# Patient Record
Sex: Male | Born: 1965 | Race: White | Hispanic: No | Marital: Married | Smoking: Current every day smoker
Health system: Southern US, Community
[De-identification: ages and names within clinical notes are randomized; demographics above are authoritative.]

---

## 2004-04-15 ENCOUNTER — Emergency Department (HOSPITAL_COMMUNITY): Admission: EM | Admit: 2004-04-15 | Discharge: 2004-04-15 | Payer: Self-pay | Admitting: *Deleted
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
## 2009-10-11 ENCOUNTER — Emergency Department (HOSPITAL_COMMUNITY): Admission: EM | Admit: 2009-10-11 | Discharge: 2009-10-11 | Payer: Self-pay | Admitting: Emergency Medicine

## 2009-11-08 ENCOUNTER — Emergency Department: Payer: Self-pay | Admitting: Emergency Medicine

## 2010-07-14 ENCOUNTER — Other Ambulatory Visit: Payer: Self-pay | Admitting: *Deleted

## 2010-07-18 ENCOUNTER — Ambulatory Visit
Admission: RE | Admit: 2010-07-18 | Discharge: 2010-07-18 | Disposition: A | Discharge status: Home / Self Care | Payer: 59 | Source: Ambulatory Visit | Attending: *Deleted | Admitting: *Deleted

## 2010-10-09 ENCOUNTER — Ambulatory Visit (INDEPENDENT_AMBULATORY_CARE_PROVIDER_SITE_OTHER): Payer: 59

## 2010-10-09 DIAGNOSIS — I10 Essential (primary) hypertension: Secondary | ICD-10-CM

## 2010-10-09 DIAGNOSIS — Z79899 Other long term (current) drug therapy: Secondary | ICD-10-CM

## 2010-10-09 DIAGNOSIS — F418 Other specified anxiety disorders: Secondary | ICD-10-CM

## 2010-10-09 DIAGNOSIS — B20 Human immunodeficiency virus [HIV] disease: Secondary | ICD-10-CM

## 2010-10-09 DIAGNOSIS — Z113 Encounter for screening for infections with a predominantly sexual mode of transmission: Secondary | ICD-10-CM

## 2010-10-09 DIAGNOSIS — Z111 Encounter for screening for respiratory tuberculosis: Secondary | ICD-10-CM

## 2010-10-09 LAB — COMPREHENSIVE METABOLIC PANEL
BUN: 9 mg/dL (ref 6–23)
CO2: 28 mEq/L (ref 19–32)
Calcium: 9.9 mg/dL (ref 8.4–10.5)
Chloride: 98 mEq/L (ref 96–112)
Creat: 0.74 mg/dL (ref 0.50–1.35)

## 2010-10-09 LAB — LIPID PANEL
Cholesterol: 199 mg/dL (ref 0–200)
Triglycerides: 93 mg/dL (ref <150)

## 2010-10-10 LAB — CBC WITH DIFFERENTIAL/PLATELET
Basophils Absolute: 0 10*3/uL (ref 0.0–0.1)
HCT: 52.7 % — ABNORMAL HIGH (ref 39.0–52.0)
Hemoglobin: 18.2 g/dL — ABNORMAL HIGH (ref 13.0–17.0)
Lymphocytes Relative: 22 % (ref 12–46)
Monocytes Absolute: 0.4 10*3/uL (ref 0.1–1.0)
Monocytes Relative: 8 % (ref 3–12)
Neutro Abs: 3 10*3/uL (ref 1.7–7.7)
RDW: 17.1 % — ABNORMAL HIGH (ref 11.5–15.5)
WBC: 4.3 10*3/uL (ref 4.0–10.5)

## 2010-10-10 LAB — T-HELPER CELL (CD4) - (RCID CLINIC ONLY): CD4 T Cell Abs: 250 uL — ABNORMAL LOW (ref 400–2700)

## 2010-10-13 LAB — HIV-1 RNA QUANT-NO REFLEX-BLD
HIV 1 RNA Quant: 43000 copies/mL — ABNORMAL HIGH (ref <20)
HIV-1 RNA Quant, Log: 4.63 {Log} — ABNORMAL HIGH (ref <1.30)

## 2010-10-15 LAB — TB SKIN TEST: Induration: 0

## 2010-10-27 ENCOUNTER — Encounter: Payer: Self-pay | Admitting: Infectious Diseases

## 2010-10-27 ENCOUNTER — Ambulatory Visit (INDEPENDENT_AMBULATORY_CARE_PROVIDER_SITE_OTHER): Payer: 59 | Admitting: Infectious Diseases

## 2010-10-27 VITALS — BP 164/89 | HR 93 | Temp 99.0°F | Ht 69.0 in | Wt 160.0 lb

## 2010-10-27 DIAGNOSIS — F418 Other specified anxiety disorders: Secondary | ICD-10-CM | POA: Insufficient documentation

## 2010-10-27 DIAGNOSIS — B20 Human immunodeficiency virus [HIV] disease: Secondary | ICD-10-CM

## 2010-10-27 DIAGNOSIS — Z21 Asymptomatic human immunodeficiency virus [HIV] infection status: Secondary | ICD-10-CM

## 2010-10-27 DIAGNOSIS — F341 Dysthymic disorder: Secondary | ICD-10-CM

## 2010-10-27 DIAGNOSIS — I10 Essential (primary) hypertension: Secondary | ICD-10-CM | POA: Insufficient documentation

## 2010-10-27 MED ORDER — EFAVIRENZ-EMTRICITAB-TENOFOVIR 600-200-300 MG PO TABS: 1.0000 | ORAL_TABLET | Freq: Every day | ORAL | Status: DC

## 2010-10-27 NOTE — Assessment & Plan Note (Signed)
Hopefully this will improve somewhat as he starts on ART and feels more secure about his health.

## 2010-10-27 NOTE — Assessment & Plan Note (Signed)
He will continue on his losaartan and will f/u with PCP.

## 2010-10-27 NOTE — Progress Notes (Signed)
  Subjective:    Patient ID: Andrew Farmer, male    DOB: 08/13/1965, 45 y.o.   MRN: [id number]  HPI 45 yo M with HIV+ since February 8,2012 after having rapid test. He was seen at [facility] but felt like he was being pressured to be in ACTG and wanted to go to different provider. He had naive genotype there. CD4 250 and VL 43k (10-09-10).    Review of Systems  Constitutional: Negative for fever and chills.  Gastrointestinal: Negative for diarrhea and constipation.  Genitourinary: Negative for dysuria.  Hematological: Negative for adenopathy. Does not bruise/bleed easily.  Psychiatric/Behavioral: The patient is nervous/anxious.   no dysphagia, no oral ulcers, no thrush.      Objective:   Physical Exam  Constitutional: He appears well-developed and well-nourished.  HENT:       Thrush, mild, L tonsil.   Eyes: EOM are normal. Pupils are equal, round, and reactive to light.  Neck: Neck supple.  Cardiovascular: Normal rate, regular rhythm and normal heart sounds.   Pulmonary/Chest: Effort normal and breath sounds normal.  Abdominal: Soft. Bowel sounds are normal. There is no tenderness. There is no rebound.  Lymphadenopathy:    He has cervical adenopathy.  Skin: Skin is warm and dry. No rash noted.       Small subaceous cyst mid back (<1cm)  Psychiatric:       anxious          Assessment & Plan:

## 2010-10-27 NOTE — Progress Notes (Signed)
Pt has decided to transfer from [facility] because he felt they were trying to use him in multiple studies without actually treating his HIV. He is very anxious and has a friend along for support. He requested that I guarantee we will not force him into any type of research study.  He is ready to start treatment if needed and feel the need to have his lab work reviewed without the threat of research studies.

## 2010-10-27 NOTE — Assessment & Plan Note (Signed)
Med options are discussed with him- will start him on atripla due to his use of H2. He is aware of possible side effects- abn dreams, rash. His partner has tested HIV-, he will continue to get tested every 6 months. They are given condoms. He is cautioned that he may drink ETOH in moderation. Will see him back in 1 month and recheck his labs, hepatitis studies.

## 2010-10-27 NOTE — Progress Notes (Deleted)
  Subjective:    Patient ID: Andrew Farmer, male    DOB: Aug 27, 1965, 45 y.o.   MRN: [id number]  HPI    Review of Systems     Objective:   Physical Exam        Assessment & Plan:

## 2010-11-05 ENCOUNTER — Ambulatory Visit: Payer: 59 | Admitting: Infectious Diseases

## 2010-11-07 ENCOUNTER — Telehealth: Payer: Self-pay | Admitting: *Deleted
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
## 2010-12-01 ENCOUNTER — Ambulatory Visit (INDEPENDENT_AMBULATORY_CARE_PROVIDER_SITE_OTHER): Payer: 59 | Admitting: Infectious Diseases

## 2010-12-01 ENCOUNTER — Encounter: Payer: Self-pay | Admitting: Infectious Diseases

## 2010-12-01 VITALS — BP 152/96 | HR 51 | Temp 98.8°F | Ht 69.0 in | Wt 149.2 lb

## 2010-12-01 DIAGNOSIS — Z23 Encounter for immunization: Secondary | ICD-10-CM

## 2010-12-01 DIAGNOSIS — Z21 Asymptomatic human immunodeficiency virus [HIV] infection status: Secondary | ICD-10-CM

## 2010-12-01 DIAGNOSIS — I10 Essential (primary) hypertension: Secondary | ICD-10-CM

## 2010-12-01 DIAGNOSIS — F341 Dysthymic disorder: Secondary | ICD-10-CM

## 2010-12-01 DIAGNOSIS — B20 Human immunodeficiency virus [HIV] disease: Secondary | ICD-10-CM

## 2010-12-01 DIAGNOSIS — F418 Other specified anxiety disorders: Secondary | ICD-10-CM
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
## 2010-12-01 NOTE — Assessment & Plan Note (Signed)
i encouraged him to take full dose of ambien as opposed to breaking pills in half.

## 2010-12-01 NOTE — Assessment & Plan Note (Signed)
He will continue to f/u with PCP with whom i am grateful to partner.

## 2010-12-01 NOTE — Assessment & Plan Note (Signed)
Tolerating atripla ok, hopefully side effects will lessen with time. Will recheck his VL and CD4 today, he will call back for results next week. He is offered condoms (already has), gets flu shot today. rtc 10 weeks.

## 2010-12-03 LAB — HIV-1 RNA ULTRAQUANT REFLEX TO GENTYP+: HIV-1 RNA Quant, Log: 2.7 {Log} — ABNORMAL HIGH (ref <1.30)

## 2010-12-05 ENCOUNTER — Telehealth: Payer: Self-pay | Admitting: *Deleted

## 2010-12-05 NOTE — Telephone Encounter (Signed)
Patient called for lab results and results given. Wendall Mola CMA

## 2011-01-06 ENCOUNTER — Encounter: Payer: Self-pay | Admitting: Infectious Diseases

## 2011-04-14 ENCOUNTER — Other Ambulatory Visit: Payer: Self-pay | Admitting: Infectious Diseases

## 2011-04-14 DIAGNOSIS — B20 Human immunodeficiency virus [HIV] disease: Secondary | ICD-10-CM

## 2011-04-16 ENCOUNTER — Other Ambulatory Visit: Payer: 59

## 2011-04-20 ENCOUNTER — Other Ambulatory Visit: Payer: 59

## 2011-05-27 ENCOUNTER — Other Ambulatory Visit: Payer: 59

## 2011-05-27 DIAGNOSIS — B20 Human immunodeficiency virus [HIV] disease: Secondary | ICD-10-CM

## 2011-05-28 LAB — COMPREHENSIVE METABOLIC PANEL
BUN: 11 mg/dL (ref 6–23)
CO2: 32 mEq/L (ref 19–32)
Calcium: 9.9 mg/dL (ref 8.4–10.5)
Creat: 0.75 mg/dL (ref 0.50–1.35)
Glucose, Bld: 92 mg/dL (ref 70–99)
Total Bilirubin: 0.4 mg/dL (ref 0.3–1.2)

## 2011-05-28 LAB — CBC WITH DIFFERENTIAL/PLATELET
Eosinophils Absolute: 0.1 10*3/uL (ref 0.0–0.7)
Eosinophils Relative: 1 % (ref 0–5)
HCT: 46.6 % (ref 39.0–52.0)
Lymphs Abs: 3 10*3/uL (ref 0.7–4.0)
MCH: 33.2 pg (ref 26.0–34.0)
MCV: 90.5 fL (ref 78.0–100.0)
Monocytes Absolute: 0.8 10*3/uL (ref 0.1–1.0)
Monocytes Relative: 10 % (ref 3–12)
Platelets: 258 10*3/uL (ref 150–400)
RBC: 5.15 MIL/uL (ref 4.22–5.81)

## 2011-05-28 LAB — T-HELPER CELL (CD4) - (RCID CLINIC ONLY)
CD4 % Helper T Cell: 32 % — ABNORMAL LOW (ref 33–55)
CD4 T Cell Abs: 950 uL (ref 400–2700)

## 2011-06-01 LAB — HIV-1 RNA QUANT-NO REFLEX-BLD
HIV 1 RNA Quant: <20 copies/mL (ref <20)
HIV-1 RNA Quant, Log: <1.3 {Log} (ref <1.30)

## 2011-06-02 ENCOUNTER — Telehealth: Payer: Self-pay | Admitting: Licensed Clinical Social Worker

## 2011-06-02 ENCOUNTER — Telehealth: Payer: Self-pay

## 2011-06-02 NOTE — Telephone Encounter (Signed)
Patient called wanting his lab results from 05/27/2011, I explained to him that all of his results were not in yet and he wanted to know when he could call to get them. I explained to him that he has an appointment for 06/10/2011 and he could discuss his labs with the doctor. He complained that this has never happened before and we have always given him his labs over the phone. I explained to him that this was a rule of the clinic and we were unable to give him the labs over the phone. He did not like what I had to say and called to speak to management.

## 2011-06-02 NOTE — Telephone Encounter (Signed)
   Telephone call transferred to me from triage due to patient requesting to speak with a supervisor.   Pt is calling to request the latest results of his labs.  He was told by Tamika (CMA) he was not able to obtain his labs on the phone.  Pt states he has the legal right to his lab results and he has called before and had no problem. Pt was informed Tamika gave him correct information. I stated  we have a policy that requires the patient to come for his scheduled OV to discuss lab results.  He states he has never been told this before and asked when the policy started.   I informed the patient this was not a new policy but,  at our last office meeting staff was told to enforce the rule of no labs prior to the OV. I explained the purpose was to allow the patient to have face to face time with the physician to discuss his results. I also said sometimes the patients call for their results and no show for the office visit and we do this also to protect the privacy of the patients.  He stated he was not our "typical patient , he was of high status and income. He stated his place of employment to assure me he was "different".  Pt was informed I would check with Dr Ninetta Lights to see if permission could be given to release the lab results prior to the OV.  4:45 pm  Pt left message on voice mail stating I was to call him before  5  pm today and it was near five and he expected a call back within the next ten minutes. I explained I was busy in the clinic and had intentions of calling the patient before leaving today.   Pt was given his  CD-4 and viral load results. He was advised to have a conversation with Dr Ninetta Lights at the next office visit regarding the rules related to receiving lab results via phone or prior to OV. Pt stated he was calling our patient experience officer to report the "hoops we put him through to get his lab results".  He knew his rights and this was his blood and he could get the results whenever  he requested them.   Pt was informed we were only trying to follow policy and protect the privacy of the patients.  He states he was still going to submit his compaint so he could be an advocate for other patients.   Laurell Josephs, RN , BSN

## 2011-06-03 ENCOUNTER — Ambulatory Visit: Payer: 59 | Admitting: Infectious Diseases

## 2011-06-10 ENCOUNTER — Encounter: Payer: Self-pay | Admitting: Infectious Diseases

## 2011-06-10 ENCOUNTER — Ambulatory Visit (INDEPENDENT_AMBULATORY_CARE_PROVIDER_SITE_OTHER): Payer: 59 | Admitting: Infectious Diseases

## 2011-06-10 DIAGNOSIS — B20 Human immunodeficiency virus [HIV] disease: Secondary | ICD-10-CM

## 2011-06-10 DIAGNOSIS — I10 Essential (primary) hypertension: Secondary | ICD-10-CM

## 2011-06-10 DIAGNOSIS — Z21 Asymptomatic human immunodeficiency virus [HIV] infection status: Secondary | ICD-10-CM

## 2011-06-10 DIAGNOSIS — Z720 Tobacco use: Secondary | ICD-10-CM

## 2011-06-10 DIAGNOSIS — F17200 Nicotine dependence, unspecified, uncomplicated: Secondary | ICD-10-CM

## 2011-06-10 NOTE — Assessment & Plan Note (Signed)
Encouraged him to quit, he is resistant to this.

## 2011-06-10 NOTE — Assessment & Plan Note (Addendum)
He is doing very well on his current ART. Will continue this and see him back in 6 months. Offered condoms. Vaccines are up to date, need to check his Hep A-C serologies. I gave him my email address so that he can get his lab results.

## 2011-06-10 NOTE — Progress Notes (Signed)
  Subjective:    Patient ID: Andrew Farmer, male    DOB: 1965/08/15, 46 y.o.   MRN: [id number]  HPI 46 yo M with HIV+ since February 8,2012 after having rapid test. He was seen at [facility] but felt like he was being pressured to be in ACTG and wanted to go to different provider. He had naive genotype there. CD4 250 and VL 43k (10-09-10). He was started on atripla in July 2012.  Still having some vivid dreams. Still some prn use of ambien, some hangover effect. Continues to smoke. Father recently placed in hospice, pt has been estranged. Took himself off bp meds and had very high BP. Now back on, has been back to PCP.No exercise "but I am quite active".     Review of Systems  Constitutional: Negative for fever, chills, appetite change and unexpected weight change.  Gastrointestinal: Negative for diarrhea and constipation.  Genitourinary: Negative for dysuria.       Objective:   Physical Exam  Constitutional: He appears well-developed and well-nourished.  HENT:  Mouth/Throat: No oropharyngeal exudate.  Eyes: EOM are normal. Pupils are equal, round, and reactive to light.  Neck: Neck supple.  Cardiovascular: Normal rate, regular rhythm and normal heart sounds.   Pulmonary/Chest: Effort normal and breath sounds normal.  Abdominal: Soft. Bowel sounds are normal. He exhibits no distension. There is no tenderness.  Lymphadenopathy:    He has no cervical adenopathy.  Psychiatric: He has a normal mood and affect.          Assessment & Plan:

## 2011-06-10 NOTE — Assessment & Plan Note (Signed)
Much better now that he is taking his med. Greatly appreciate PCP f/u.

## 2011-11-30 ENCOUNTER — Other Ambulatory Visit: Payer: Self-pay | Admitting: *Deleted

## 2011-11-30 DIAGNOSIS — B20 Human immunodeficiency virus [HIV] disease: Secondary | ICD-10-CM

## 2011-11-30 MED ORDER — EFAVIRENZ-EMTRICITAB-TENOFOVIR 600-200-300 MG PO TABS: 1.0000 | ORAL_TABLET | Freq: Every day | ORAL | Status: DC

## 2011-12-22 ENCOUNTER — Other Ambulatory Visit: Payer: 59

## 2011-12-22 DIAGNOSIS — B20 Human immunodeficiency virus [HIV] disease: Secondary | ICD-10-CM

## 2011-12-22 LAB — COMPREHENSIVE METABOLIC PANEL
ALT: 41 U/L (ref 0–53)
AST: 29 U/L (ref 0–37)
Albumin: 5 g/dL (ref 3.5–5.2)
CO2: 29 mEq/L (ref 19–32)
Calcium: 10.1 mg/dL (ref 8.4–10.5)
Chloride: 99 mEq/L (ref 96–112)
Creat: 0.9 mg/dL (ref 0.50–1.35)
Potassium: 3.9 mEq/L (ref 3.5–5.3)
Sodium: 137 mEq/L (ref 135–145)
Total Protein: 7.5 g/dL (ref 6.0–8.3)

## 2011-12-22 LAB — HEPATITIS C ANTIBODY: HCV Ab: NEGATIVE

## 2011-12-22 LAB — CBC
Platelets: 272 10*3/uL (ref 150–400)
RBC: 5.44 MIL/uL (ref 4.22–5.81)
RDW: 13.1 % (ref 11.5–15.5)
WBC: 6 10*3/uL (ref 4.0–10.5)

## 2011-12-22 LAB — LIPID PANEL
HDL: 53 mg/dL (ref >39)
Total CHOL/HDL Ratio: 4.1 Ratio
VLDL: 30 mg/dL (ref 0–40)

## 2011-12-23 LAB — HIV-1 RNA QUANT-NO REFLEX-BLD: HIV 1 RNA Quant: 29 copies/mL — ABNORMAL HIGH (ref <20)

## 2011-12-24 LAB — HEPATITIS A ANTIBODY, TOTAL: Hep A Total Ab: POSITIVE — AB

## 2012-01-05 ENCOUNTER — Encounter: Payer: Self-pay | Admitting: Infectious Diseases

## 2012-01-05 ENCOUNTER — Ambulatory Visit (INDEPENDENT_AMBULATORY_CARE_PROVIDER_SITE_OTHER): Payer: 59 | Admitting: Infectious Diseases

## 2012-01-05 VITALS — BP 160/97 | HR 90 | Temp 97.6°F | Ht 69.0 in | Wt 164.0 lb

## 2012-01-05 DIAGNOSIS — Z79899 Other long term (current) drug therapy: Secondary | ICD-10-CM

## 2012-01-05 DIAGNOSIS — I10 Essential (primary) hypertension: Secondary | ICD-10-CM

## 2012-01-05 DIAGNOSIS — Z23 Encounter for immunization: Secondary | ICD-10-CM

## 2012-01-05 DIAGNOSIS — Z113 Encounter for screening for infections with a predominantly sexual mode of transmission: Secondary | ICD-10-CM

## 2012-01-05 DIAGNOSIS — B20 Human immunodeficiency virus [HIV] disease: Secondary | ICD-10-CM

## 2012-01-05 DIAGNOSIS — Z21 Asymptomatic human immunodeficiency virus [HIV] infection status: Secondary | ICD-10-CM

## 2012-01-05 NOTE — Assessment & Plan Note (Signed)
He is asx. I encouraged him to monitor BP at home and at drug stores. He will f/u with his PCP
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
## 2012-01-05 NOTE — Progress Notes (Signed)
  Subjective:    Patient ID: Andrew Farmer, male    DOB: October 15, 1965, 46 y.o.   MRN: [id number]  HPI 46 yo M with HIV+ since February 8,2012 after having rapid test. He was seen at [facility] but felt like he was being pressured to be in ACTG and wanted to go to different provider. He had naive genotype there. CD4 250 and VL 43k (10-09-10). He was started on atripla in July 2012.  Recently had injury to face/between eyes, slid off a ladder. No vision changes, no headaches.  Doing well with atripla, has vivid dreams still.   HIV 1 RNA Quant (copies/mL)  Date Value  12/22/2011 29*  05/27/2011 <20   12/01/2010 496*     CD4 T Cell Abs (cmm)  Date Value  12/22/2011 760   05/27/2011 950   12/01/2010 390*      Review of Systems  Constitutional: Negative for appetite change and unexpected weight change.  Cardiovascular: Negative for chest pain.  Gastrointestinal: Negative for diarrhea and constipation.  Genitourinary: Negative for dysuria.  Neurological: Negative for headaches.       Objective:   Physical Exam  Constitutional: He appears well-developed and well-nourished.    HENT:  Mouth/Throat: No oropharyngeal exudate.  Eyes: EOM are normal. Pupils are equal, round, and reactive to light.  Neck: Neck supple.  Cardiovascular: Normal rate, regular rhythm and normal heart sounds.   Pulmonary/Chest: Effort normal and breath sounds normal.  Abdominal: Soft. Bowel sounds are normal. He exhibits no distension.  Lymphadenopathy:    He has no cervical adenopathy.  Skin:       Abrasion, L forearm. Clean.           Assessment & Plan:

## 2012-04-12 ENCOUNTER — Other Ambulatory Visit: Payer: Self-pay | Admitting: *Deleted

## 2012-04-12 DIAGNOSIS — B20 Human immunodeficiency virus [HIV] disease: Secondary | ICD-10-CM

## 2012-04-12 MED ORDER — EFAVIRENZ-EMTRICITAB-TENOFOVIR 600-200-300 MG PO TABS: 1.0000 | ORAL_TABLET | Freq: Every day | ORAL | Status: DC

## 2012-08-15 ENCOUNTER — Other Ambulatory Visit: Payer: Self-pay | Admitting: Licensed Clinical Social Worker

## 2012-08-15 DIAGNOSIS — B20 Human immunodeficiency virus [HIV] disease: Secondary | ICD-10-CM

## 2012-08-15 MED ORDER — EFAVIRENZ-EMTRICITAB-TENOFOVIR 600-200-300 MG PO TABS: 1.0000 | ORAL_TABLET | Freq: Every day | ORAL | Status: DC

## 2012-08-22 ENCOUNTER — Other Ambulatory Visit: Payer: Self-pay | Admitting: *Deleted

## 2012-08-22 DIAGNOSIS — B20 Human immunodeficiency virus [HIV] disease: Secondary | ICD-10-CM

## 2012-08-22 MED ORDER — EFAVIRENZ-EMTRICITAB-TENOFOVIR 600-200-300 MG PO TABS: 1.0000 | ORAL_TABLET | Freq: Every day | ORAL | Status: DC

## 2012-08-22 NOTE — Telephone Encounter (Signed)
Pt needs to make f/u appt w/ Dr. Hatcher. 

## 2013-01-24 ENCOUNTER — Other Ambulatory Visit: Payer: Self-pay | Admitting: Infectious Diseases

## 2013-02-26 ENCOUNTER — Other Ambulatory Visit: Payer: Self-pay | Admitting: Infectious Diseases

## 2013-03-27 ENCOUNTER — Other Ambulatory Visit: Payer: Self-pay | Admitting: Infectious Diseases

## 2013-04-11 ENCOUNTER — Other Ambulatory Visit: Payer: 59

## 2013-04-11 DIAGNOSIS — Z113 Encounter for screening for infections with a predominantly sexual mode of transmission: Secondary | ICD-10-CM

## 2013-04-11 DIAGNOSIS — Z79899 Other long term (current) drug therapy: Secondary | ICD-10-CM

## 2013-04-11 DIAGNOSIS — B20 Human immunodeficiency virus [HIV] disease: Secondary | ICD-10-CM

## 2013-04-12 LAB — COMPREHENSIVE METABOLIC PANEL
ALBUMIN: 4.5 g/dL (ref 3.5–5.2)
ALT: 34 U/L (ref 0–53)
AST: 29 U/L (ref 0–37)
Alkaline Phosphatase: 64 U/L (ref 39–117)
BUN: 10 mg/dL (ref 6–23)
CALCIUM: 9.6 mg/dL (ref 8.4–10.5)
CHLORIDE: 100 meq/L (ref 96–112)
CO2: 29 meq/L (ref 19–32)
Creat: 0.8 mg/dL (ref 0.50–1.35)
Glucose, Bld: 105 mg/dL — ABNORMAL HIGH (ref 70–99)
Potassium: 3.9 mEq/L (ref 3.5–5.3)
Sodium: 137 mEq/L (ref 135–145)
Total Bilirubin: 0.5 mg/dL (ref 0.3–1.2)
Total Protein: 7.2 g/dL (ref 6.0–8.3)

## 2013-04-12 LAB — LIPID PANEL
CHOLESTEROL: 207 mg/dL — AB (ref 0–200)
HDL: 61 mg/dL (ref >39)
LDL Cholesterol: 129 mg/dL — ABNORMAL HIGH (ref 0–99)
TRIGLYCERIDES: 86 mg/dL (ref <150)
Total CHOL/HDL Ratio: 3.4 Ratio
VLDL: 17 mg/dL (ref 0–40)

## 2013-04-12 LAB — HIV-1 RNA QUANT-NO REFLEX-BLD
HIV 1 RNA Quant: <20 copies/mL (ref <20)
HIV-1 RNA Quant, Log: <1.3 {Log} (ref <1.30)

## 2013-04-12 LAB — CBC WITH DIFFERENTIAL/PLATELET
BASOS ABS: 0 10*3/uL (ref 0.0–0.1)
Basophils Relative: 0 % (ref 0–1)
EOS PCT: 0 % (ref 0–5)
Eosinophils Absolute: 0 10*3/uL (ref 0.0–0.7)
HCT: 44.9 % (ref 39.0–52.0)
HEMOGLOBIN: 16.5 g/dL (ref 13.0–17.0)
LYMPHS ABS: 2 10*3/uL (ref 0.7–4.0)
LYMPHS PCT: 37 % (ref 12–46)
MCH: 33.6 pg (ref 26.0–34.0)
MCHC: 36.7 g/dL — ABNORMAL HIGH (ref 30.0–36.0)
MCV: 91.4 fL (ref 78.0–100.0)
MONO ABS: 0.4 10*3/uL (ref 0.1–1.0)
MONOS PCT: 8 % (ref 3–12)
Neutro Abs: 3 10*3/uL (ref 1.7–7.7)
Neutrophils Relative %: 55 % (ref 43–77)
Platelets: 250 10*3/uL (ref 150–400)
RBC: 4.91 MIL/uL (ref 4.22–5.81)
RDW: 13.6 % (ref 11.5–15.5)
WBC: 5.5 10*3/uL (ref 4.0–10.5)

## 2013-04-12 LAB — RPR

## 2013-04-13 LAB — T-HELPER CELL (CD4) - (RCID CLINIC ONLY)
CD4 T CELL ABS: 900 /uL (ref 400–2700)
CD4 T CELL HELPER: 38 % (ref 33–55)

## 2013-04-25 ENCOUNTER — Other Ambulatory Visit: Payer: Self-pay | Admitting: *Deleted

## 2013-04-25 ENCOUNTER — Telehealth: Payer: Self-pay | Admitting: *Deleted

## 2013-04-25 ENCOUNTER — Encounter: Payer: Self-pay | Admitting: Infectious Diseases

## 2013-04-25 ENCOUNTER — Ambulatory Visit (INDEPENDENT_AMBULATORY_CARE_PROVIDER_SITE_OTHER): Payer: 59 | Admitting: Infectious Diseases

## 2013-04-25 VITALS — BP 155/91 | HR 91 | Temp 98.4°F | Ht 69.0 in | Wt 160.0 lb

## 2013-04-25 DIAGNOSIS — F17200 Nicotine dependence, unspecified, uncomplicated: Secondary | ICD-10-CM

## 2013-04-25 DIAGNOSIS — Z21 Asymptomatic human immunodeficiency virus [HIV] infection status: Secondary | ICD-10-CM

## 2013-04-25 DIAGNOSIS — B20 Human immunodeficiency virus [HIV] disease: Secondary | ICD-10-CM

## 2013-04-25 DIAGNOSIS — Z79899 Other long term (current) drug therapy: Secondary | ICD-10-CM

## 2013-04-25 DIAGNOSIS — Z720 Tobacco use: Secondary | ICD-10-CM

## 2013-04-25 DIAGNOSIS — I10 Essential (primary) hypertension: Secondary | ICD-10-CM

## 2013-04-25 DIAGNOSIS — F341 Dysthymic disorder: Secondary | ICD-10-CM

## 2013-04-25 DIAGNOSIS — F418 Other specified anxiety disorders: Secondary | ICD-10-CM

## 2013-04-25 MED ORDER — CLONAZEPAM 0.5 MG PO TABS: 0.5000 mg | ORAL_TABLET | Freq: Two times a day (BID) | ORAL | Status: DC | PRN

## 2013-04-25 MED ORDER — EFAVIRENZ-EMTRICITAB-TENOFOVIR 600-200-300 MG PO TABS: 1.0000 | ORAL_TABLET | Freq: Every day | ORAL | Status: DC

## 2013-04-25 MED ORDER — ZOLPIDEM TARTRATE 10 MG PO TABS: 10.0000 mg | ORAL_TABLET | Freq: Every evening | ORAL | Status: DC | PRN

## 2013-04-25 NOTE — Assessment & Plan Note (Signed)
Takes klonipin very rarely. Sleeping rx also rarely. Both are refilled today.

## 2013-04-25 NOTE — Progress Notes (Signed)
   Subjective:    Patient ID: Courtney HeysJohn E Allums, male    DOB: 04/12/1965, 48 y.o.   MRN: [id number]  HPI 48 yo M with HIV+ since February 8,2012 after having rapid test. He was seen [facility] but felt like he was being pressured to be in ACTG and wanted to go to different provider. He had naive genotype there. CD4 250 and VL 43k (10-09-10). He was started on atripla in July 2012. Has been doing well, still smoking, 4 beer/day.  Had been checking BP at home- those have been a little high.   HIV 1 RNA Quant (copies/mL)  Date Value  04/11/2013 <20   12/22/2011 29*  05/27/2011 <20      CD4 T Cell Abs (/uL)  Date Value  04/11/2013 900   12/22/2011 760   05/27/2011 950      Review of Systems  Constitutional: Negative for appetite change and unexpected weight change.  Respiratory: Negative for shortness of breath.   Cardiovascular: Negative for chest pain.  Gastrointestinal: Negative for diarrhea, constipation and anal bleeding.  Genitourinary: Negative for difficulty urinating.  Neurological: Negative for headaches.  no anal lesions.      Objective:   Physical Exam  Constitutional: He appears well-developed and well-nourished.  HENT:  Mouth/Throat: No oropharyngeal exudate.  Eyes: EOM are normal. Pupils are equal, round, and reactive to light.  Neck: Normal range of motion. Neck supple.  Cardiovascular: Normal rate, regular rhythm and normal heart sounds.   Pulmonary/Chest: Effort normal and breath sounds normal.  Abdominal: Soft. Bowel sounds are normal. He exhibits no distension. There is no tenderness.  Lymphadenopathy:    He has no cervical adenopathy.          Assessment & Plan:

## 2013-04-25 NOTE — Assessment & Plan Note (Signed)
He continues to home monitor, better at home. Has been asx.

## 2013-04-25 NOTE — Telephone Encounter (Signed)
Verbal order per Dr. Ninetta LightsHatcher given to [facility] nurse, Efraim KaufmannMelissa, for a vancomycin trough, HIV antibody, and Hepatitis A antibody lab. Wendall MolaJacqueline Cockerham CMA

## 2013-04-25 NOTE — Assessment & Plan Note (Signed)
He is doing very well. vax up to date. He is working on diet/exercise to improve lipids. Encouraged him to quit smoking. Offered/refused condoms. Still having some trouble with abn dreams. Will see him back in 6 months.

## 2013-04-25 NOTE — Assessment & Plan Note (Signed)
Encouraged to quit. 

## 2013-10-05 ENCOUNTER — Other Ambulatory Visit: Payer: Self-pay | Admitting: *Deleted

## 2013-10-05 ENCOUNTER — Telehealth: Payer: Self-pay | Admitting: *Deleted

## 2013-10-05 DIAGNOSIS — B20 Human immunodeficiency virus [HIV] disease: Secondary | ICD-10-CM

## 2013-10-05 MED ORDER — EFAVIRENZ-EMTRICITAB-TENOFOVIR 600-200-300 MG PO TABS: 1.0000 | ORAL_TABLET | Freq: Every day | ORAL | Status: DC

## 2013-10-05 NOTE — Telephone Encounter (Signed)
Due to insurance caps, patient wants to be seen on an annual basis.  Please advise if this is ok. Andree CossHowell, Zenobia Kuennen M, RN

## 2013-10-10 ENCOUNTER — Other Ambulatory Visit: Payer: 59

## 2013-10-17 NOTE — Telephone Encounter (Signed)
Ok with me. thanks 

## 2013-10-18 NOTE — Telephone Encounter (Signed)
Left message for patient to call back at his convenience.  Will notify him when he calls back that an annual visit is OK per Dr. Ninetta LightsHatcher.  Need to know if he needs additional refills at this time.

## 2013-10-25 ENCOUNTER — Ambulatory Visit: Payer: 59 | Admitting: Infectious Diseases

## 2014-07-29 ENCOUNTER — Other Ambulatory Visit: Payer: Self-pay | Admitting: Infectious Diseases

## 2014-07-30 ENCOUNTER — Telehealth: Payer: Self-pay | Admitting: *Deleted

## 2014-07-30 NOTE — Telephone Encounter (Signed)
Patient requested refill of Remus Lofflerambien- denied, as patient has not been seen or had labs since 04/2013.  Patient/Dr. Ninetta LightsHatcher agreed to annual visits.  Patient's partner returned the call, made an appointment for 6/15.  Per partner, patient is not in need of refills at this time.  He has not missed medication. Andree CossHowell, Shyra Emile M, RN

## 2014-09-19 ENCOUNTER — Ambulatory Visit: Payer: Self-pay | Admitting: Infectious Diseases

## 2014-10-23 ENCOUNTER — Other Ambulatory Visit: Payer: Self-pay | Admitting: Infectious Diseases

## 2014-10-30 ENCOUNTER — Telehealth: Payer: Self-pay | Admitting: *Deleted

## 2014-10-30 ENCOUNTER — Other Ambulatory Visit: Payer: Self-pay | Admitting: *Deleted

## 2014-10-30 MED ORDER — EFAVIRENZ-EMTRICITAB-TENOFOVIR 600-200-300 MG PO TABS: 1.0000 | ORAL_TABLET | Freq: Every day | ORAL | Status: DC

## 2014-10-30 NOTE — Telephone Encounter (Signed)
thanks

## 2014-10-30 NOTE — Telephone Encounter (Signed)
Patient's husband called to cancel the appointment tomorrow.  Patient has not been seen in 18 months.  He states they cannot afford it at this time as he had surgery and used up his HSA and the patient is currently without insurance. He is also interested in the PREP study as he is HIV negative.   RN returned the call, had to leave a message.  RN advised that there are options (RW/ADAP) that can allow the patient to be seen even without insurance.  RN advised the patient to call and reschedule as soon as possible.  RN also left the number to Deirdre Evener, RN for inquiry into the PREP study. Andree Coss, RN

## 2014-10-30 NOTE — Telephone Encounter (Signed)
Patient does not qualify for PREP study - states he is not "high risk" by their definition.  He would like to be seen by you for PREP in October when his husband is seen.  OK to book two appointments?

## 2014-10-31 ENCOUNTER — Ambulatory Visit: Payer: Self-pay | Admitting: Infectious Diseases

## 2015-01-09 ENCOUNTER — Other Ambulatory Visit: Payer: Self-pay

## 2015-01-09 DIAGNOSIS — Z113 Encounter for screening for infections with a predominantly sexual mode of transmission: Secondary | ICD-10-CM

## 2015-01-09 DIAGNOSIS — Z79899 Other long term (current) drug therapy: Secondary | ICD-10-CM

## 2015-01-09 DIAGNOSIS — B20 Human immunodeficiency virus [HIV] disease: Secondary | ICD-10-CM

## 2015-01-09 LAB — CBC WITH DIFFERENTIAL/PLATELET
BASOS ABS: 0 10*3/uL (ref 0.0–0.1)
Basophils Relative: 0 % (ref 0–1)
Eosinophils Absolute: 0.1 10*3/uL (ref 0.0–0.7)
Eosinophils Relative: 1 % (ref 0–5)
HEMATOCRIT: 41.1 % (ref 39.0–52.0)
HEMOGLOBIN: 14.7 g/dL (ref 13.0–17.0)
LYMPHS PCT: 43 % (ref 12–46)
Lymphs Abs: 2.6 10*3/uL (ref 0.7–4.0)
MCH: 33.4 pg (ref 26.0–34.0)
MCHC: 35.8 g/dL (ref 30.0–36.0)
MCV: 93.4 fL (ref 78.0–100.0)
MPV: 9.8 fL (ref 8.6–12.4)
Monocytes Absolute: 0.6 10*3/uL (ref 0.1–1.0)
Monocytes Relative: 10 % (ref 3–12)
NEUTROS ABS: 2.8 10*3/uL (ref 1.7–7.7)
Neutrophils Relative %: 46 % (ref 43–77)
Platelets: 253 10*3/uL (ref 150–400)
RBC: 4.4 MIL/uL (ref 4.22–5.81)
RDW: 12.7 % (ref 11.5–15.5)
WBC: 6 10*3/uL (ref 4.0–10.5)

## 2015-01-09 LAB — COMPLETE METABOLIC PANEL WITH GFR
ALBUMIN: 4.4 g/dL (ref 3.6–5.1)
ALK PHOS: 52 U/L (ref 40–115)
ALT: 15 U/L (ref 9–46)
AST: 19 U/L (ref 10–40)
BILIRUBIN TOTAL: 0.6 mg/dL (ref 0.2–1.2)
BUN: 8 mg/dL (ref 7–25)
CALCIUM: 9.2 mg/dL (ref 8.6–10.3)
CO2: 27 mmol/L (ref 20–31)
Chloride: 102 mmol/L (ref 98–110)
Creat: 0.87 mg/dL (ref 0.60–1.35)
GFR, Est African American: >89 mL/min (ref >=60)
GLUCOSE: 89 mg/dL (ref 65–99)
Potassium: 3.9 mmol/L (ref 3.5–5.3)
SODIUM: 139 mmol/L (ref 135–146)
TOTAL PROTEIN: 6.6 g/dL (ref 6.1–8.1)

## 2015-01-09 LAB — LIPID PANEL
CHOLESTEROL: 163 mg/dL (ref 125–200)
HDL: 90 mg/dL (ref >=40)
LDL Cholesterol: 66 mg/dL (ref <130)
Total CHOL/HDL Ratio: 1.8 Ratio (ref <=5.0)
Triglycerides: 35 mg/dL (ref <150)
VLDL: 7 mg/dL (ref <30)

## 2015-01-09 NOTE — Addendum Note (Signed)
Addended by: Mariea Clonts D on: 01/09/2015 04:05 PM   Modules accepted: Orders

## 2015-01-10 LAB — RPR

## 2015-01-10 LAB — T-HELPER CELL (CD4) - (RCID CLINIC ONLY)
CD4 T CELL ABS: 1240 /uL (ref 400–2700)
CD4 T CELL HELPER: 44 % (ref 33–55)

## 2015-01-11 LAB — HIV-1 RNA QUANT-NO REFLEX-BLD: HIV 1 RNA Quant: <20 copies/mL (ref <20)

## 2015-01-23 ENCOUNTER — Ambulatory Visit: Payer: Self-pay | Admitting: Infectious Diseases

## 2015-02-01 ENCOUNTER — Other Ambulatory Visit: Payer: Self-pay | Admitting: Infectious Diseases

## 2015-02-01 DIAGNOSIS — B20 Human immunodeficiency virus [HIV] disease: Secondary | ICD-10-CM

## 2015-02-26 ENCOUNTER — Ambulatory Visit (INDEPENDENT_AMBULATORY_CARE_PROVIDER_SITE_OTHER): Payer: 59 | Admitting: Infectious Diseases

## 2015-02-26 ENCOUNTER — Encounter: Payer: Self-pay | Admitting: Infectious Diseases

## 2015-02-26 VITALS — BP 138/78 | HR 66 | Temp 99.0°F | Ht 69.0 in | Wt 144.0 lb

## 2015-02-26 DIAGNOSIS — B20 Human immunodeficiency virus [HIV] disease: Secondary | ICD-10-CM

## 2015-02-26 DIAGNOSIS — Z720 Tobacco use: Secondary | ICD-10-CM

## 2015-02-26 DIAGNOSIS — Z113 Encounter for screening for infections with a predominantly sexual mode of transmission: Secondary | ICD-10-CM | POA: Diagnosis not present

## 2015-02-26 DIAGNOSIS — Z21 Asymptomatic human immunodeficiency virus [HIV] infection status: Secondary | ICD-10-CM | POA: Diagnosis not present

## 2015-02-26 DIAGNOSIS — Z79899 Other long term (current) drug therapy: Secondary | ICD-10-CM

## 2015-02-26 DIAGNOSIS — I10 Essential (primary) hypertension: Secondary | ICD-10-CM

## 2015-02-26 NOTE — Assessment & Plan Note (Signed)
Encouraged to quit. 

## 2015-02-26 NOTE — Assessment & Plan Note (Signed)
He is doing very well.  Offered to change his ART to odefsy (he will research) Offered/refused condoms.  Has gotten flu shot.

## 2015-02-26 NOTE — Progress Notes (Signed)
   Subjective:    Patient ID: Courtney HeysJohn E Raggio, male    DOB: 05/01/1965, 49 y.o.   MRN: [id number]  HPI 49 yo M with HIV+ since February 8,2012 after having rapid test. [facility] but felt like he was being pressured to be in ACTG and wanted to go to different provider. He had naive genotype there. CD4 250 and VL 43k (10-09-10). He was started on atripla in July 2012.  Has been doing well, still smoking, 4 beer/day.  At his previous visit was note to have increased BP.  Has been monitoring at home and has been much better. His wt has come down due to dental extractions.  Partner is on prep.   HIV 1 RNA QUANT (copies/mL)  Date Value  01/09/2015 <20  04/11/2013 <20  12/22/2011 29*   CD4 T CELL ABS  Date Value  01/09/2015 1240 /uL  04/11/2013 900 /uL  12/22/2011 760 cmm    Review of Systems  Constitutional: Negative for appetite change, fatigue and unexpected weight change.  Gastrointestinal: Negative for constipation.  Genitourinary: Negative for difficulty urinating.  Musculoskeletal: Negative for arthralgias.       Objective:   Physical Exam  Constitutional: He appears well-developed and well-nourished.  HENT:  Mouth/Throat: No oropharyngeal exudate.  Eyes: EOM are normal. Pupils are equal, round, and reactive to light.  Neck: Neck supple.  Cardiovascular: Normal rate, regular rhythm and normal heart sounds.   Pulmonary/Chest: Effort normal and breath sounds normal.  Abdominal: Soft. Bowel sounds are normal. There is no tenderness. There is no rebound.  Musculoskeletal: He exhibits no edema.  Lymphadenopathy:    He has no cervical adenopathy.      Assessment & Plan:

## 2015-02-26 NOTE — Assessment & Plan Note (Signed)
Improved will continue to monitor 

## 2015-03-06 ENCOUNTER — Telehealth: Payer: Self-pay | Admitting: *Deleted

## 2015-03-06 ENCOUNTER — Other Ambulatory Visit: Payer: Self-pay | Admitting: *Deleted

## 2015-03-06 DIAGNOSIS — B20 Human immunodeficiency virus [HIV] disease: Secondary | ICD-10-CM

## 2015-03-06 MED ORDER — EMTRICITAB-RILPIVIR-TENOFOV DF 200-25-300 MG PO TABS: 1.0000 | ORAL_TABLET | Freq: Every day | ORAL | Status: DC

## 2015-03-06 MED ORDER — EFAVIRENZ-EMTRICITAB-TENOFOVIR 600-200-300 MG PO TABS: ORAL_TABLET | ORAL | Status: DC

## 2015-03-06 NOTE — Telephone Encounter (Signed)
Pt remaining on Atripla until Darrin LuisGenvoya is covered by his insurance.  Unable to speak with the patient.  Requested pharmacy to call patient when rx ready for pick up

## 2015-06-10 ENCOUNTER — Telehealth: Payer: Self-pay

## 2015-06-10 NOTE — Telephone Encounter (Signed)
PA completed and sent to plan.

## 2015-06-10 NOTE — Telephone Encounter (Signed)
Patient is calling regarding a new change in pharmacy.  He will need authorization for Complera.  [phone] .

## 2015-06-17 ENCOUNTER — Telehealth: Payer: Self-pay | Admitting: *Deleted

## 2015-06-17 NOTE — Telephone Encounter (Signed)
Received fax from [facility] stating that Complera is able to be filled by his pharmacy as it is on his list of covered drugs, no prior auth required. RN faxed the letter to his pharmacy - [phone]. Andree CossHowell, Myley Bahner M, RN

## 2015-06-17 NOTE — Telephone Encounter (Addendum)
Needing high dollar [facility] PA completed on the phone.  [id number].  May take 24-72 hours for approval.

## 2015-06-17 NOTE — Telephone Encounter (Signed)
Is there a phone number that goes with the PA#? thanks

## 2015-06-18 NOTE — Telephone Encounter (Addendum)
PA approved for exception to high dollar co-pay through 06/16/16.

## 2015-09-07 ENCOUNTER — Other Ambulatory Visit: Payer: Self-pay | Admitting: Infectious Diseases

## 2015-09-07 DIAGNOSIS — B20 Human immunodeficiency virus [HIV] disease: Secondary | ICD-10-CM

## 2015-11-04 ENCOUNTER — Other Ambulatory Visit: Payer: Self-pay | Admitting: Infectious Diseases

## 2015-11-04 DIAGNOSIS — B20 Human immunodeficiency virus [HIV] disease: Secondary | ICD-10-CM

## 2015-11-04 NOTE — Telephone Encounter (Signed)
Left message for pt to call [facility] to make MD and LAB appts ASAP.

## 2016-03-11 ENCOUNTER — Other Ambulatory Visit: Payer: Self-pay | Admitting: Infectious Diseases

## 2016-03-11 DIAGNOSIS — B20 Human immunodeficiency virus [HIV] disease: Secondary | ICD-10-CM

## 2016-03-23 ENCOUNTER — Other Ambulatory Visit: Payer: Self-pay | Admitting: Infectious Diseases

## 2016-03-23 DIAGNOSIS — B20 Human immunodeficiency virus [HIV] disease: Secondary | ICD-10-CM

## 2016-03-25 ENCOUNTER — Other Ambulatory Visit: Payer: Self-pay | Admitting: *Deleted

## 2016-03-25 DIAGNOSIS — B20 Human immunodeficiency virus [HIV] disease: Secondary | ICD-10-CM

## 2016-03-25 MED ORDER — EMTRICITAB-RILPIVIR-TENOFOV DF 200-25-300 MG PO TABS: 1.0000 | ORAL_TABLET | Freq: Every day | ORAL | 0 refills | Status: DC

## 2016-03-26 ENCOUNTER — Telehealth: Payer: Self-pay | Admitting: *Deleted

## 2016-03-26 NOTE — Telephone Encounter (Signed)
Refill request from [facility] and patient not seen in over a year. Advised pharmacist to have patient call the clinic for appointment. Andrew MolaJacqueline Cockerham

## 2016-04-01 ENCOUNTER — Other Ambulatory Visit: Payer: Self-pay | Admitting: *Deleted

## 2016-04-01 DIAGNOSIS — B20 Human immunodeficiency virus [HIV] disease: Secondary | ICD-10-CM

## 2016-04-01 MED ORDER — EMTRICITAB-RILPIVIR-TENOFOV DF 200-25-300 MG PO TABS: 1.0000 | ORAL_TABLET | Freq: Every day | ORAL | 0 refills | Status: DC

## 2016-04-02 ENCOUNTER — Ambulatory Visit: Payer: 59

## 2016-04-20 ENCOUNTER — Other Ambulatory Visit: Payer: 59

## 2016-04-20 DIAGNOSIS — Z79899 Other long term (current) drug therapy: Secondary | ICD-10-CM

## 2016-04-20 DIAGNOSIS — B20 Human immunodeficiency virus [HIV] disease: Secondary | ICD-10-CM

## 2016-04-20 DIAGNOSIS — Z113 Encounter for screening for infections with a predominantly sexual mode of transmission: Secondary | ICD-10-CM

## 2016-04-20 LAB — CBC WITH DIFFERENTIAL/PLATELET
BASOS ABS: 0 {cells}/uL (ref 0–200)
Basophils Relative: 0 %
EOS PCT: 1 %
Eosinophils Absolute: 75 cells/uL (ref 15–500)
HCT: 43.6 % (ref 38.5–50.0)
Hemoglobin: 15.3 g/dL (ref 13.2–17.1)
Lymphocytes Relative: 30 %
Lymphs Abs: 2250 cells/uL (ref 850–3900)
MCH: 33.8 pg — AB (ref 27.0–33.0)
MCHC: 35.1 g/dL (ref 32.0–36.0)
MCV: 96.5 fL (ref 80.0–100.0)
MONOS PCT: 10 %
MPV: 9.6 fL (ref 7.5–12.5)
Monocytes Absolute: 750 cells/uL (ref 200–950)
NEUTROS PCT: 59 %
Neutro Abs: 4425 cells/uL (ref 1500–7800)
PLATELETS: 253 10*3/uL (ref 140–400)
RBC: 4.52 MIL/uL (ref 4.20–5.80)
RDW: 13.3 % (ref 11.0–15.0)
WBC: 7.5 10*3/uL (ref 3.8–10.8)

## 2016-04-20 LAB — COMPLETE METABOLIC PANEL WITH GFR
ALBUMIN: 4.6 g/dL (ref 3.6–5.1)
ALK PHOS: 43 U/L (ref 40–115)
ALT: 24 U/L (ref 9–46)
AST: 26 U/L (ref 10–40)
BILIRUBIN TOTAL: 0.5 mg/dL (ref 0.2–1.2)
BUN: 16 mg/dL (ref 7–25)
CO2: 26 mmol/L (ref 20–31)
CREATININE: 0.84 mg/dL (ref 0.60–1.35)
Calcium: 9.6 mg/dL (ref 8.6–10.3)
Chloride: 101 mmol/L (ref 98–110)
GFR, Est African American: >89 mL/min (ref >=60)
GFR, Est Non African American: >89 mL/min (ref >=60)
GLUCOSE: 116 mg/dL — AB (ref 65–99)
Potassium: 3.6 mmol/L (ref 3.5–5.3)
SODIUM: 137 mmol/L (ref 135–146)
TOTAL PROTEIN: 7.1 g/dL (ref 6.1–8.1)

## 2016-04-20 LAB — LIPID PANEL
CHOL/HDL RATIO: 1.9 ratio (ref <5.0)
CHOLESTEROL: 158 mg/dL (ref <200)
HDL: 82 mg/dL (ref >40)
LDL Cholesterol: 64 mg/dL (ref <100)
Triglycerides: 60 mg/dL (ref <150)
VLDL: 12 mg/dL (ref <30)

## 2016-04-21 LAB — T-HELPER CELL (CD4) - (RCID CLINIC ONLY)
CD4 T CELL HELPER: 43 % (ref 33–55)
CD4 T Cell Abs: 1070 /uL (ref 400–2700)

## 2016-04-21 LAB — RPR

## 2016-04-23 LAB — HIV-1 RNA QUANT-NO REFLEX-BLD
HIV 1 RNA QUANT: NOT DETECTED {copies}/mL (ref <20)
HIV-1 RNA Quant, Log: <1.3 Log copies/mL (ref <1.30)

## 2016-04-24 LAB — HLA B*5701: HLA-B*5701 w/rflx HLA-B High: NEGATIVE

## 2016-05-04 ENCOUNTER — Ambulatory Visit: Payer: 59 | Admitting: Infectious Diseases

## 2016-05-06 ENCOUNTER — Ambulatory Visit (INDEPENDENT_AMBULATORY_CARE_PROVIDER_SITE_OTHER): Payer: 59 | Admitting: Infectious Diseases

## 2016-05-06 ENCOUNTER — Encounter: Payer: Self-pay | Admitting: Infectious Diseases

## 2016-05-06 VITALS — BP 119/74 | HR 75 | Temp 98.6°F | Ht 69.0 in | Wt 151.8 lb

## 2016-05-06 DIAGNOSIS — I10 Essential (primary) hypertension: Secondary | ICD-10-CM

## 2016-05-06 DIAGNOSIS — B20 Human immunodeficiency virus [HIV] disease: Secondary | ICD-10-CM | POA: Diagnosis not present

## 2016-05-06 DIAGNOSIS — Z720 Tobacco use: Secondary | ICD-10-CM | POA: Diagnosis not present

## 2016-05-06 DIAGNOSIS — Z789 Other specified health status: Secondary | ICD-10-CM | POA: Diagnosis not present

## 2016-05-06 DIAGNOSIS — Z113 Encounter for screening for infections with a predominantly sexual mode of transmission: Secondary | ICD-10-CM

## 2016-05-06 DIAGNOSIS — Z79899 Other long term (current) drug therapy: Secondary | ICD-10-CM

## 2016-05-06 MED ORDER — EMTRICITAB-RILPIVIR-TENOFOV DF 200-25-300 MG PO TABS: 1.0000 | ORAL_TABLET | Freq: Every day | ORAL | 4 refills | Status: DC

## 2016-05-06 NOTE — Assessment & Plan Note (Signed)
Encouraged to quit.  Couldn't tolerate chantix.  Going to get nicotine replacement from PCP

## 2016-05-06 NOTE — Assessment & Plan Note (Addendum)
Will see him yearly Refill his meds Has gotten flu Partner on PrEP, tested q3 months.  Offered to help him with meds if his insurance changes Will change complera to odefsy when his insurance is clarifired, stable.  Offered/refused condoms.

## 2016-05-06 NOTE — Assessment & Plan Note (Addendum)
On ARB.  F/u PCP Doing well

## 2016-05-06 NOTE — Progress Notes (Signed)
   Subjective:    Patient ID: Courtney HeysJohn E Ambrocio, male    DOB: 11/26/1965, 51 y.o.   MRN: [id number]  HPI 51 yo M with HIV+ since February 8,2012 after having rapid test. [facility] but felt like he was being pressured to be in ACTG and wanted to go to different provider. He had naive genotype there. CD4 250 and VL 43k (10-09-10). He was started on atripla in July 2012--> complera March 2017 Partner on PrEP. No problems with ART.  Got flu vax Sept 2017.   HIV 1 RNA Quant (copies/mL)  Date Value  04/20/2016 <20 NOT DETECTED  01/09/2015 <20  04/11/2013 <20   CD4 T Cell Abs (/uL)  Date Value  04/20/2016 1,070  01/09/2015 1,240  04/11/2013 900    Review of Systems  Constitutional: Negative for appetite change and unexpected weight change.  Gastrointestinal: Negative for constipation and diarrhea.  Genitourinary: Negative for difficulty urinating.   Exercise is working in a [employer].     Objective:   Physical Exam  Constitutional: He appears well-developed and well-nourished.  HENT:  Mouth/Throat: No oropharyngeal exudate.  Eyes: EOM are normal. Pupils are equal, round, and reactive to light.  Neck: Neck supple.  Cardiovascular: Normal rate, regular rhythm and normal heart sounds.   Pulmonary/Chest: Effort normal and breath sounds normal.  Abdominal: Soft. Bowel sounds are normal. There is no tenderness. There is no rebound.  Musculoskeletal: He exhibits no edema.  Lymphadenopathy:    He has no cervical adenopathy.       Assessment & Plan:

## 2016-07-09 ENCOUNTER — Other Ambulatory Visit: Payer: Self-pay | Admitting: *Deleted

## 2016-07-09 ENCOUNTER — Ambulatory Visit: Payer: Self-pay

## 2016-07-09 DIAGNOSIS — B20 Human immunodeficiency virus [HIV] disease: Secondary | ICD-10-CM

## 2016-07-09 MED ORDER — EMTRICITAB-RILPIVIR-TENOFOV DF 200-25-300 MG PO TABS: 1.0000 | ORAL_TABLET | Freq: Every day | ORAL | 4 refills | Status: DC

## 2016-07-14 ENCOUNTER — Encounter: Payer: Self-pay | Admitting: Infectious Diseases

## 2016-08-11 ENCOUNTER — Telehealth: Payer: Self-pay | Admitting: *Deleted

## 2016-08-11 ENCOUNTER — Other Ambulatory Visit: Payer: Self-pay | Admitting: *Deleted

## 2016-08-11 DIAGNOSIS — I10 Essential (primary) hypertension: Secondary | ICD-10-CM

## 2016-08-11 DIAGNOSIS — B20 Human immunodeficiency virus [HIV] disease: Secondary | ICD-10-CM

## 2016-08-11 MED ORDER — LOSARTAN POTASSIUM 100 MG PO TABS: 100.0000 mg | ORAL_TABLET | Freq: Every day | ORAL | 3 refills | Status: DC

## 2016-08-11 MED ORDER — EMTRICITAB-RILPIVIR-TENOFOV DF 200-25-300 MG PO TABS: 1.0000 | ORAL_TABLET | Freq: Every day | ORAL | 3 refills | Status: DC

## 2016-08-11 NOTE — Telephone Encounter (Signed)
Ok to refill 

## 2016-08-11 NOTE — Telephone Encounter (Signed)
[facility] calling to request refills of complera and losartan.  Please advise if ok to refill losartan, as this is listed as a historic medication. Andree CossHowell, Isaiha Asare M, RN

## 2016-08-12 NOTE — Telephone Encounter (Signed)
Losartan 100mg  not on ADAP formulary. [facility] patient could take benicar 20 mg daily. Please advise. Andree CossHowell, Tia Hieronymus M, RN

## 2016-08-13 ENCOUNTER — Telehealth: Payer: Self-pay | Admitting: *Deleted

## 2016-08-13 NOTE — Telephone Encounter (Signed)
This is not on patient's med list so [facility] Thank you

## 2016-08-13 NOTE — Telephone Encounter (Signed)
[facility] stating that losartan is not covered on patient's ADAP and is requesting an alternative. Please advise

## 2016-08-13 NOTE — Telephone Encounter (Signed)
Send in Benicar 40mg  daily

## 2016-08-14 ENCOUNTER — Other Ambulatory Visit: Payer: Self-pay | Admitting: Pharmacist Clinician (PhC)/ Clinical Pharmacy Specialist

## 2016-08-14 MED ORDER — OLMESARTAN MEDOXOMIL 40 MG PO TABS: 40.0000 mg | ORAL_TABLET | Freq: Every day | ORAL | 5 refills | Status: DC

## 2016-08-14 NOTE — Telephone Encounter (Signed)
It's sent

## 2016-08-14 NOTE — Progress Notes (Signed)
Changing losartan to Benicar due to ADAP formulary.

## 2016-08-19 ENCOUNTER — Other Ambulatory Visit: Payer: Self-pay | Admitting: Pharmacist Clinician (PhC)/ Clinical Pharmacy Specialist

## 2016-08-19 MED ORDER — HYDROCHLOROTHIAZIDE 25 MG PO TABS: 25.0000 mg | ORAL_TABLET | Freq: Every day | ORAL | 11 refills | Status: DC

## 2016-08-19 NOTE — Progress Notes (Signed)
Since ADAP covers Benicar and HCTZ separately, we'll send in both components.

## 2016-11-09 ENCOUNTER — Ambulatory Visit: Payer: Self-pay

## 2016-11-12 ENCOUNTER — Encounter: Payer: Self-pay | Admitting: Infectious Diseases

## 2016-11-17 ENCOUNTER — Other Ambulatory Visit: Payer: Self-pay | Admitting: Infectious Diseases

## 2016-11-17 ENCOUNTER — Telehealth: Payer: Self-pay | Admitting: *Deleted

## 2016-11-17 DIAGNOSIS — F418 Other specified anxiety disorders: Secondary | ICD-10-CM

## 2016-11-17 DIAGNOSIS — B20 Human immunodeficiency virus [HIV] disease: Secondary | ICD-10-CM

## 2016-11-17 NOTE — Telephone Encounter (Signed)
Patient called to request a Rx for Ambien. Advised he can not see his PCP because he no longer has insurance and can not afford the copay. He also states that he has not used the medication for about 1 year but recently has been having trouble staying asleep. Advised him we have not given him this Rx since 2015 and he will need to be seen before we can refill if the doctor chooses to. He advised he will call back to schedule an appt. Advised him not to wait to long as we are already scheduling out to October for [facility].

## 2016-12-14 ENCOUNTER — Other Ambulatory Visit: Payer: Self-pay | Admitting: Infectious Diseases

## 2016-12-14 DIAGNOSIS — B20 Human immunodeficiency virus [HIV] disease: Secondary | ICD-10-CM

## 2017-01-07 ENCOUNTER — Other Ambulatory Visit: Payer: Self-pay | Admitting: Infectious Diseases

## 2017-01-07 DIAGNOSIS — B20 Human immunodeficiency virus [HIV] disease: Secondary | ICD-10-CM

## 2017-02-05 ENCOUNTER — Other Ambulatory Visit: Payer: Self-pay | Admitting: Infectious Diseases

## 2017-04-21 ENCOUNTER — Other Ambulatory Visit: Payer: Self-pay

## 2017-04-21 DIAGNOSIS — Z113 Encounter for screening for infections with a predominantly sexual mode of transmission: Secondary | ICD-10-CM

## 2017-04-21 DIAGNOSIS — Z79899 Other long term (current) drug therapy: Secondary | ICD-10-CM

## 2017-04-21 DIAGNOSIS — B20 Human immunodeficiency virus [HIV] disease: Secondary | ICD-10-CM

## 2017-04-22 LAB — CBC
HEMATOCRIT: 40.7 % (ref 38.5–50.0)
Hemoglobin: 14.7 g/dL (ref 13.2–17.1)
MCH: 33.5 pg — ABNORMAL HIGH (ref 27.0–33.0)
MCHC: 36.1 g/dL — AB (ref 32.0–36.0)
MCV: 92.7 fL (ref 80.0–100.0)
MPV: 10.4 fL (ref 7.5–12.5)
PLATELETS: 235 10*3/uL (ref 140–400)
RBC: 4.39 10*6/uL (ref 4.20–5.80)
RDW: 11.5 % (ref 11.0–15.0)
WBC: 6.3 10*3/uL (ref 3.8–10.8)

## 2017-04-22 LAB — COMPREHENSIVE METABOLIC PANEL
AG Ratio: 1.9 (calc) (ref 1.0–2.5)
ALBUMIN MSPROF: 4.7 g/dL (ref 3.6–5.1)
ALT: 36 U/L (ref 9–46)
AST: 55 U/L — AB (ref 10–40)
Alkaline phosphatase (APISO): 47 U/L (ref 40–115)
BUN: 15 mg/dL (ref 7–25)
CHLORIDE: 106 mmol/L (ref 98–110)
CO2: 23 mmol/L (ref 20–32)
CREATININE: 1.12 mg/dL (ref 0.60–1.35)
Calcium: 9.8 mg/dL (ref 8.6–10.3)
GLOBULIN: 2.5 g/dL (ref 1.9–3.7)
GLUCOSE: 84 mg/dL (ref 65–99)
Potassium: 4.9 mmol/L (ref 3.5–5.3)
SODIUM: 137 mmol/L (ref 135–146)
TOTAL PROTEIN: 7.2 g/dL (ref 6.1–8.1)
Total Bilirubin: 0.7 mg/dL (ref 0.2–1.2)

## 2017-04-22 LAB — LIPID PANEL
CHOL/HDL RATIO: 1.8 (calc) (ref <5.0)
Cholesterol: 159 mg/dL (ref <200)
HDL: 90 mg/dL (ref >40)
LDL CHOLESTEROL (CALC): 61 mg/dL
NON-HDL CHOLESTEROL (CALC): 69 mg/dL (ref <130)
TRIGLYCERIDES: 29 mg/dL (ref <150)

## 2017-04-22 LAB — T-HELPER CELL (CD4) - (RCID CLINIC ONLY)
CD4 % Helper T Cell: 41 % (ref 33–55)
CD4 T CELL ABS: 880 /uL (ref 400–2700)

## 2017-04-22 LAB — RPR: RPR Ser Ql: NONREACTIVE

## 2017-04-23 ENCOUNTER — Other Ambulatory Visit: Payer: Self-pay | Admitting: Infectious Diseases

## 2017-04-23 DIAGNOSIS — B20 Human immunodeficiency virus [HIV] disease: Secondary | ICD-10-CM

## 2017-04-23 LAB — HIV-1 RNA QUANT-NO REFLEX-BLD
HIV 1 RNA Quant: <20 copies/mL — AB
HIV-1 RNA QUANT, LOG: DETECTED {Log_copies}/mL — AB

## 2017-04-27 ENCOUNTER — Encounter: Payer: Self-pay | Admitting: Infectious Diseases

## 2017-05-05 ENCOUNTER — Ambulatory Visit: Payer: 59 | Admitting: Infectious Diseases

## 2017-05-05 ENCOUNTER — Encounter: Payer: Self-pay | Admitting: Infectious Diseases

## 2017-05-05 ENCOUNTER — Ambulatory Visit (INDEPENDENT_AMBULATORY_CARE_PROVIDER_SITE_OTHER): Payer: Self-pay | Admitting: Infectious Diseases

## 2017-05-05 VITALS — BP 130/82 | HR 80 | Temp 98.4°F | Wt 145.0 lb

## 2017-05-05 DIAGNOSIS — F418 Other specified anxiety disorders: Secondary | ICD-10-CM

## 2017-05-05 DIAGNOSIS — I10 Essential (primary) hypertension: Secondary | ICD-10-CM

## 2017-05-05 DIAGNOSIS — Z113 Encounter for screening for infections with a predominantly sexual mode of transmission: Secondary | ICD-10-CM

## 2017-05-05 DIAGNOSIS — Z720 Tobacco use: Secondary | ICD-10-CM

## 2017-05-05 DIAGNOSIS — B20 Human immunodeficiency virus [HIV] disease: Secondary | ICD-10-CM

## 2017-05-05 MED ORDER — EMTRICITAB-RILPIVIR-TENOFOV AF 200-25-25 MG PO TABS: 1.0000 | ORAL_TABLET | Freq: Every day | ORAL | 3 refills | Status: DC

## 2017-05-05 MED ORDER — ZOLPIDEM TARTRATE 10 MG PO TABS: 10.0000 mg | ORAL_TABLET | Freq: Every evening | ORAL | 3 refills | Status: DC | PRN

## 2017-05-05 MED ORDER — CLONAZEPAM 0.5 MG PO TABS: 0.5000 mg | ORAL_TABLET | Freq: Two times a day (BID) | ORAL | 3 refills | Status: DC | PRN

## 2017-05-05 NOTE — Assessment & Plan Note (Signed)
Doing well on Benicar.  He has questions about wether this causes Cancer. Await FDA review.

## 2017-05-05 NOTE — Assessment & Plan Note (Signed)
He is doing very well Partner on prep.  has condoms [facility] reviews.  rtc in 9 months.

## 2017-05-05 NOTE — Assessment & Plan Note (Signed)
Encourage him to quit smoking.  vaping as bridge to quit.

## 2017-05-05 NOTE — Assessment & Plan Note (Signed)
Will refill his amiben and klonipin.

## 2017-05-05 NOTE — Progress Notes (Signed)
   Subjective:    Patient ID: Andrew Farmer, male    DOB: 10/02/1965, 52 y.o.   MRN: [id number]  HPI 52 yo M with HIV+ since February 8,2012 after having rapid test. [facility] but felt like he was being pressured to be in ACTG and wanted to go to different provider. He had naive genotype there. CD4 250 and VL 43k (10-09-10). He was started on atripla in July 2012--> complera March 2017 Partner on PrEP.  Got flu vax Sept 2018 Has been having worsening anxiety, insomnia. Prev took [place], [place].  Had pain in his R shoulder from his "backpack blower"  HIV 1 RNA Quant (copies/mL)  Date Value  04/21/2017 <20 DETECTED (A)  04/20/2016 <20 NOT DETECTED  01/09/2015 <20   CD4 T Cell Abs (/uL)  Date Value  04/21/2017 880  04/20/2016 1,070  01/09/2015 1,240   Still smoking. Tried cymbalta. Interested in vaping.   Review of Systems  Constitutional: Negative for appetite change and unexpected weight change.  HENT: Negative for rhinorrhea.   Respiratory: Negative for cough and shortness of breath.   Gastrointestinal: Negative for constipation and diarrhea.  Genitourinary: Negative for difficulty urinating.  Psychiatric/Behavioral: Positive for sleep disturbance. The patient is nervous/anxious.   Please see HPI. All other systems reviewed and negative.     Objective:   Physical Exam  Constitutional: He appears well-developed and well-nourished.  HENT:  Mouth/Throat: No oropharyngeal exudate.  Eyes: EOM are normal. Pupils are equal, round, and reactive to light.  Neck: Neck supple.  Cardiovascular: Normal rate, regular rhythm and normal heart sounds.  Pulmonary/Chest: Effort normal and breath sounds normal.  Abdominal: Soft. Bowel sounds are normal. There is no tenderness. There is no rebound.  Musculoskeletal: He exhibits no edema.  Lymphadenopathy:    He has no cervical adenopathy.      Assessment & Plan:

## 2017-05-19 ENCOUNTER — Other Ambulatory Visit: Payer: Self-pay | Admitting: Infectious Diseases

## 2017-05-19 ENCOUNTER — Other Ambulatory Visit: Payer: Self-pay | Admitting: Behavioral Health

## 2017-05-19 DIAGNOSIS — I10 Essential (primary) hypertension: Secondary | ICD-10-CM

## 2017-05-19 DIAGNOSIS — B20 Human immunodeficiency virus [HIV] disease: Secondary | ICD-10-CM

## 2017-05-19 NOTE — Telephone Encounter (Signed)
A refill request was received for Complera for patient however patient medication list and note state [facility] was to start [facility] and discontinue Complera.  Writer called patient to clarify if he has started taking Odefsey which was ordered at his last visit 05/05/2017. Patient states he has not talked to his partner about the switch yet and would like to continue on Complera.

## 2017-05-20 ENCOUNTER — Other Ambulatory Visit: Payer: Self-pay | Admitting: Infectious Diseases

## 2017-05-20 DIAGNOSIS — B20 Human immunodeficiency virus [HIV] disease: Secondary | ICD-10-CM

## 2017-05-25 ENCOUNTER — Other Ambulatory Visit: Payer: Self-pay | Admitting: Infectious Diseases

## 2017-05-25 DIAGNOSIS — B20 Human immunodeficiency virus [HIV] disease: Secondary | ICD-10-CM

## 2017-05-27 ENCOUNTER — Other Ambulatory Visit: Payer: Self-pay | Admitting: Infectious Diseases

## 2017-05-27 ENCOUNTER — Telehealth: Payer: Self-pay | Admitting: *Deleted

## 2017-05-27 DIAGNOSIS — B20 Human immunodeficiency virus [HIV] disease: Secondary | ICD-10-CM

## 2017-05-27 NOTE — Telephone Encounter (Signed)
Patient called and does not want to change medications. He would like to stay on Complera. Advised will let his provider know and give him a call back once I get the ok.

## 2017-05-31 ENCOUNTER — Other Ambulatory Visit: Payer: Self-pay | Admitting: *Deleted

## 2017-05-31 DIAGNOSIS — B20 Human immunodeficiency virus [HIV] disease: Secondary | ICD-10-CM

## 2017-05-31 DIAGNOSIS — Z21 Asymptomatic human immunodeficiency virus [HIV] infection status: Secondary | ICD-10-CM

## 2017-05-31 MED ORDER — EMTRICITAB-RILPIVIR-TENOFOV DF 200-25-300 MG PO TABS: 1.0000 | ORAL_TABLET | Freq: Every day | ORAL | 5 refills | Status: DC

## 2017-05-31 NOTE — Telephone Encounter (Signed)
Medication sent to the pharmacy.

## 2017-05-31 NOTE — Telephone Encounter (Signed)
Ok to refill complera thanks

## 2017-10-12 ENCOUNTER — Ambulatory Visit: Payer: Self-pay

## 2017-10-15 ENCOUNTER — Encounter: Payer: Self-pay | Admitting: Infectious Diseases

## 2017-11-18 ENCOUNTER — Other Ambulatory Visit: Payer: Self-pay | Admitting: Infectious Diseases

## 2017-11-18 DIAGNOSIS — B20 Human immunodeficiency virus [HIV] disease: Secondary | ICD-10-CM

## 2017-11-18 DIAGNOSIS — I10 Essential (primary) hypertension: Secondary | ICD-10-CM

## 2017-12-16 ENCOUNTER — Other Ambulatory Visit: Payer: Self-pay | Admitting: Infectious Diseases

## 2017-12-16 DIAGNOSIS — B20 Human immunodeficiency virus [HIV] disease: Secondary | ICD-10-CM

## 2017-12-16 DIAGNOSIS — I10 Essential (primary) hypertension: Secondary | ICD-10-CM

## 2018-01-17 ENCOUNTER — Other Ambulatory Visit: Payer: Self-pay | Admitting: Infectious Diseases

## 2018-01-17 DIAGNOSIS — B20 Human immunodeficiency virus [HIV] disease: Secondary | ICD-10-CM

## 2018-01-17 DIAGNOSIS — I10 Essential (primary) hypertension: Secondary | ICD-10-CM

## 2018-01-19 ENCOUNTER — Other Ambulatory Visit: Payer: Self-pay | Admitting: Behavioral Health

## 2018-01-19 DIAGNOSIS — B20 Human immunodeficiency virus [HIV] disease: Secondary | ICD-10-CM

## 2018-01-19 DIAGNOSIS — I10 Essential (primary) hypertension: Secondary | ICD-10-CM

## 2018-01-19 MED ORDER — EMTRICITAB-RILPIVIR-TENOFOV DF 200-25-300 MG PO TABS: 1.0000 | ORAL_TABLET | Freq: Every day | ORAL | 0 refills | Status: DC

## 2018-01-19 MED ORDER — BENICAR 40 MG PO TABS: 40.0000 mg | ORAL_TABLET | Freq: Every day | ORAL | 0 refills | Status: DC

## 2018-01-27 ENCOUNTER — Other Ambulatory Visit: Payer: Self-pay

## 2018-01-27 DIAGNOSIS — Z113 Encounter for screening for infections with a predominantly sexual mode of transmission: Secondary | ICD-10-CM

## 2018-01-27 DIAGNOSIS — B20 Human immunodeficiency virus [HIV] disease: Secondary | ICD-10-CM

## 2018-01-28 LAB — T-HELPER CELL (CD4) - (RCID CLINIC ONLY)
CD4 T CELL ABS: 970 /uL (ref 400–2700)
CD4 T CELL HELPER: 47 % (ref 33–55)

## 2018-01-31 LAB — COMPREHENSIVE METABOLIC PANEL
AG RATIO: 1.7 (calc) (ref 1.0–2.5)
ALBUMIN MSPROF: 4.8 g/dL (ref 3.6–5.1)
ALKALINE PHOSPHATASE (APISO): 50 U/L (ref 40–115)
ALT: 53 U/L — ABNORMAL HIGH (ref 9–46)
AST: 63 U/L — ABNORMAL HIGH (ref 10–35)
BUN: 15 mg/dL (ref 7–25)
CHLORIDE: 104 mmol/L (ref 98–110)
CO2: 25 mmol/L (ref 20–32)
Calcium: 10.1 mg/dL (ref 8.6–10.3)
Creat: 1.11 mg/dL (ref 0.70–1.33)
GLOBULIN: 2.8 g/dL (ref 1.9–3.7)
Glucose, Bld: 78 mg/dL (ref 65–99)
Potassium: 4.7 mmol/L (ref 3.5–5.3)
Sodium: 137 mmol/L (ref 135–146)
TOTAL PROTEIN: 7.6 g/dL (ref 6.1–8.1)
Total Bilirubin: 0.8 mg/dL (ref 0.2–1.2)

## 2018-01-31 LAB — CBC
HEMATOCRIT: 38.9 % (ref 38.5–50.0)
Hemoglobin: 14.1 g/dL (ref 13.2–17.1)
MCH: 33.7 pg — ABNORMAL HIGH (ref 27.0–33.0)
MCHC: 36.2 g/dL — ABNORMAL HIGH (ref 32.0–36.0)
MCV: 93.1 fL (ref 80.0–100.0)
MPV: 10.3 fL (ref 7.5–12.5)
Platelets: 268 10*3/uL (ref 140–400)
RBC: 4.18 10*6/uL — ABNORMAL LOW (ref 4.20–5.80)
RDW: 12.2 % (ref 11.0–15.0)
WBC: 6.1 10*3/uL (ref 3.8–10.8)

## 2018-01-31 LAB — HIV-1 RNA QUANT-NO REFLEX-BLD
HIV 1 RNA QUANT: DETECTED {copies}/mL — AB
HIV-1 RNA QUANT, LOG: DETECTED {Log_copies}/mL — AB

## 2018-01-31 LAB — RPR: RPR Ser Ql: NONREACTIVE

## 2018-02-01 LAB — URINE CYTOLOGY ANCILLARY ONLY
Chlamydia: NEGATIVE
Neisseria Gonorrhea: NEGATIVE

## 2018-02-10 ENCOUNTER — Encounter: Payer: Self-pay | Admitting: Family

## 2018-02-10 ENCOUNTER — Ambulatory Visit (INDEPENDENT_AMBULATORY_CARE_PROVIDER_SITE_OTHER): Payer: Self-pay | Admitting: Family

## 2018-02-10 VITALS — BP 113/74 | HR 85 | Temp 98.4°F | Wt 140.0 lb

## 2018-02-10 DIAGNOSIS — Z21 Asymptomatic human immunodeficiency virus [HIV] infection status: Secondary | ICD-10-CM

## 2018-02-10 DIAGNOSIS — Z23 Encounter for immunization: Secondary | ICD-10-CM

## 2018-02-10 DIAGNOSIS — R748 Abnormal levels of other serum enzymes: Secondary | ICD-10-CM

## 2018-02-10 MED ORDER — EMTRICITAB-RILPIVIR-TENOFOV AF 200-25-25 MG PO TABS: 1.0000 | ORAL_TABLET | Freq: Every day | ORAL | 5 refills | Status: DC

## 2018-02-10 NOTE — Progress Notes (Signed)
Subjective:    Patient ID: Andrew Farmer, male    DOB: 08-03-1965, 52 y.o.   MRN: [id number]  Chief Complaint  Patient presents with  . HIV Positive/AIDS     HPI:  Andrew Farmer is a 52 y.o. male who presents today for routine follow up of HIV disease.   Andrew Farmer was last seen in the office on on 05/05/2017 for routine follow-up and was maintained Complera from his initial regimen of a Atripla.  All his partner remained on PrEP. He was going to be changed to [facility] at his medication renewal.  His most recent blood work was completed on 01/27/2018 with a viral load that remains undetectable and a CD4 count of 970.  Liver function tests slightly elevated with an AST of 63 and ALT of 53.  Kidney function and electrolytes within normal ranges.  Andrew Farmer has been taking his Complera as prescribed with no adverse side effects. Takes his medication about 2 hours after he eats.  He is no longer taking the Zantac.  Has not missed any doses and has no problems obtaining the medication from his mail-order pharmacy.   Denies fevers, chills, night sweats, headaches, changes in vision, neck pain/stiffness, nausea, diarrhea, vomiting, lesions or rashes.   No Known Allergies    Outpatient Medications Prior to Visit  Medication Sig Dispense Refill  . BENICAR 40 MG tablet Take 1 tablet (40 mg total) by mouth daily. 30 tablet 0  . clonazePAM (KLONOPIN) 0.5 MG tablet Take 1 tablet (0.5 mg total) by mouth 2 (two) times daily as needed for anxiety. PCP 30 tablet 3  . zolpidem (AMBIEN) 10 MG tablet Take 1 tablet (10 mg total) by mouth at bedtime as needed. 30 tablet 3  . emtricitabine-rilpivir-tenofovir DF (COMPLERA) 200-25-300 MG tablet Take 1 tablet by mouth daily. 30 tablet 0  . ranitidine (ZANTAC) 150 MG tablet Take 150 mg by mouth 1 day or 1 dose.       No facility-administered medications prior to visit.      History reviewed. No pertinent past medical history.   History reviewed. No  pertinent surgical history.    Review of Systems  Constitutional: Negative for appetite change, chills, fatigue, fever and unexpected weight change.  Eyes: Negative for visual disturbance.  Respiratory: Negative for cough, chest tightness, shortness of breath and wheezing.   Cardiovascular: Negative for chest pain and leg swelling.  Gastrointestinal: Negative for abdominal pain, constipation, diarrhea, nausea and vomiting.  Genitourinary: Negative for dysuria, flank pain, frequency, genital sores, hematuria and urgency.  Skin: Negative for rash.  Allergic/Immunologic: Negative for immunocompromised state.  Neurological: Negative for dizziness and headaches.      Objective:    BP 113/74   Pulse 85   Temp 98.4 F (36.9 C) (Oral)   Wt 140 lb (63.5 kg)   BMI 20.67 kg/m  Nursing note and vital signs reviewed.  Physical Exam  Constitutional: He is oriented to person, place, and time. He appears well-developed. No distress.  HENT:  Mouth/Throat: Oropharynx is clear and moist.  Eyes: Conjunctivae are normal.  Neck: Neck supple.  Cardiovascular: Normal rate, regular rhythm, normal heart sounds and intact distal pulses. Exam reveals no gallop and no friction rub.  No murmur heard. Pulmonary/Chest: Effort normal and breath sounds normal. No respiratory distress. He has no wheezes. He has no rales. He exhibits no tenderness.  Abdominal: Soft. Bowel sounds are normal. There is no tenderness.  Lymphadenopathy:    He  has no cervical adenopathy.  Neurological: He is alert and oriented to person, place, and time.  Skin: Skin is warm and dry. No rash noted.  Psychiatric: He has a normal mood and affect. His behavior is normal. Judgment and thought content normal.       Assessment & Plan:   Problem List Items Addressed This Visit      Other   HIV (human immunodeficiency virus infection) (HCC) - Primary    Andrew Farmer has well controlled HIV disease with his current regimen of Complera  with no adverse side effects or missed doses. No signs/symptoms of opportunistic infection or progressive HIV disease. Influenza and Pneumovax updated today. Discussed in length of changing him to [facility] to reduce the risk of kidney dysfunction and decreased bone mineral density. Will change Complera to Odfesey. Plan for follow up with pharmacy for VL check in 1 month and with provider in 6 months or sooner if needed with lab work 1-2 weeks prior to appointment. Note: Partner is also on PrEP and discussed speaking with his PCP about changing to Descovy.       Relevant Medications   emtricitabine-rilpivir-tenofovir AF (ODEFSEY) 200-25-25 MG TABS tablet   Other Relevant Orders   T-helper cell (CD4)- ([facility] clinic only)   HIV-1 RNA quant-no reflex-bld   CBC   Comprehensive metabolic panel   RPR   Lipid panel   US ABDOMEN COMPLETE W/ELASTOGRAPHY   Pneumococcal polysaccharide vaccine 23-valent greater than or equal to 2yo subcutaneous/IM (Completed)   Elevated liver enzymes    Andrew Farmer continues to have elevated liver enzymes with concern for his alcohol consumption as he is currently consuming about 6 drinks per night most nights of the week. Will obtain ultrasound with elastography for baseline. Discussed importance on cutting back on alcohol intake with risk for progressive liver dysfunction and possible cirrhosis.        Other Visit Diagnoses    Need for pneumococcal vaccination       Relevant Orders   Pneumococcal polysaccharide vaccine 23-valent greater than or equal to 2yo subcutaneous/IM (Completed)   Need for immunization against influenza       Relevant Orders   Flu Vaccine QUAD 36+ mos IM (Completed)       I have discontinued Andrew Farmer's ranitidine and emtricitabine-rilpivir-tenofovir DF. I am also having him start on emtricitabine-rilpivir-tenofovir AF. Additionally, I am having him maintain his clonazePAM, zolpidem, and BENICAR.   Meds ordered this encounter    Medications  . emtricitabine-rilpivir-tenofovir AF (ODEFSEY) 200-25-25 MG TABS tablet    Sig: Take 1 tablet by mouth daily.    Dispense:  30 tablet    Refill:  5    Order Specific Question:   Supervising Provider    Answer:   Judyann Munson [[id number]]     Follow-up: Return in about 6 months (around 08/11/2018), or if symptoms worsen or fail to improve.   Marcos Eke, MSN, FNP-C Nurse Practitioner [facility] for Infectious Disease [facility] Office phone: [phone] Pager: [phone] [facility] Main number: [phone]

## 2018-02-10 NOTE — Assessment & Plan Note (Signed)
Andrew Farmer continues to have elevated liver enzymes with concern for his alcohol consumption as he is currently consuming about 6 drinks per night most nights of the week. Will obtain ultrasound with elastography for baseline. Discussed importance on cutting back on alcohol intake with risk for progressive liver dysfunction and possible cirrhosis.

## 2018-02-10 NOTE — Assessment & Plan Note (Signed)
Andrew Farmer has well controlled HIV disease with his current regimen of Complera with no adverse side effects or missed doses. No signs/symptoms of opportunistic infection or progressive HIV disease. Influenza and Pneumovax updated today. Discussed in length of changing him to [facility] to reduce the risk of kidney dysfunction and decreased bone mineral density. Will change Complera to Odfesey. Plan for follow up with pharmacy for VL check in 1 month and with provider in 6 months or sooner if needed with lab work 1-2 weeks prior to appointment. Note: Partner is also on PrEP and discussed speaking with his PCP about changing to Descovy.

## 2018-02-10 NOTE — Patient Instructions (Signed)
Nice to meet you.  Please start taking the [facility] once your receive it.  Plan for a follow up office visit with Cassie in the pharmacy in 1 month to recheck your viral load.  Follow up office visit in 6 months or sooner if needed with blood work 1-2 weeks prior to your appointment.

## 2018-02-14 ENCOUNTER — Other Ambulatory Visit: Payer: Self-pay | Admitting: Family

## 2018-02-14 DIAGNOSIS — I10 Essential (primary) hypertension: Secondary | ICD-10-CM

## 2018-02-16 ENCOUNTER — Ambulatory Visit (HOSPITAL_COMMUNITY)
Admission: RE | Admit: 2018-02-16 | Discharge: 2018-02-16 | Disposition: A | Discharge status: Home / Self Care | Payer: Self-pay | Source: Ambulatory Visit | Attending: Family | Admitting: Family

## 2018-02-16 DIAGNOSIS — K760 Fatty (change of) liver, not elsewhere classified: Secondary | ICD-10-CM | POA: Insufficient documentation

## 2018-02-16 DIAGNOSIS — Z21 Asymptomatic human immunodeficiency virus [HIV] infection status: Secondary | ICD-10-CM | POA: Insufficient documentation

## 2018-03-16 ENCOUNTER — Ambulatory Visit: Payer: Self-pay

## 2018-04-11 ENCOUNTER — Ambulatory Visit (INDEPENDENT_AMBULATORY_CARE_PROVIDER_SITE_OTHER): Payer: Self-pay | Admitting: Pharmacist

## 2018-04-11 DIAGNOSIS — Z21 Asymptomatic human immunodeficiency virus [HIV] infection status: Secondary | ICD-10-CM

## 2018-04-11 NOTE — Progress Notes (Signed)
HPI: Andrew Farmer E Farmer is a 53 y.o. male who presents to the [facility] pharmacy clinic for HIV follow-up after switching medications.  Patient Active Problem List   Diagnosis Date Noted  . Elevated liver enzymes 02/10/2018  . Hepatitis B immune 05/06/2016  . Tobacco abuse 06/10/2011  . Essential hypertension 10/27/2010  . Depression with anxiety 10/27/2010  . HIV (human immunodeficiency virus infection) (HCC) 10/27/2010    Patient's Medications  New Prescriptions   No medications on file  Previous Medications   BENICAR 40 MG TABLET    TAKE 1 TABLET(40 MG) BY MOUTH DAILY   CLONAZEPAM (KLONOPIN) 0.5 MG TABLET    Take 1 tablet (0.5 mg total) by mouth 2 (two) times daily as needed for anxiety. PCP   EMTRICITABINE-RILPIVIR-TENOFOVIR AF (ODEFSEY) 200-25-25 MG TABS TABLET    Take 1 tablet by mouth daily.   ZOLPIDEM (AMBIEN) 10 MG TABLET    Take 1 tablet (10 mg total) by mouth at bedtime as needed.  Modified Medications   No medications on file  Discontinued Medications   No medications on file    Allergies: No Known Allergies  Past Medical History: No past medical history on file.  Social History: Social History   Socioeconomic History  . Marital status: Single    Spouse name: Not on file  . Number of children: Not on file  . Years of education: Not on file  . Highest education level: Not on file  Occupational History  . Not on file  Social Needs  . Financial resource strain: Not on file  . Food insecurity:    Worry: Not on file    Inability: Not on file  . Transportation needs:    Medical: Not on file    Non-medical: Not on file  Tobacco Use  . Smoking status: Current Every Day Smoker    Packs/day: 1.30    Years: 20.00    Pack years: 26.00    Types: Cigarettes  . Smokeless tobacco: Never Used  . Tobacco comment: has chantix at home - not yet ready  Substance and Sexual Activity  . Alcohol use: Yes    Alcohol/week: 28.0 standard drinks    Types: 28 Standard drinks or  equivalent per week    Comment: decreased from daily alcohol intake  . Drug use: No  . Sexual activity: Yes    Partners: Male    Comment: declined condoms today  Lifestyle  . Physical activity:    Days per week: Not on file    Minutes per session: Not on file  . Stress: Not on file  Relationships  . Social connections:    Talks on phone: Not on file    Gets together: Not on file    Attends religious service: Not on file    Active member of club or organization: Not on file    Attends meetings of clubs or organizations: Not on file    Relationship status: Not on file  Other Topics Concern  . Not on file  Social History Narrative  . Not on file    Labs: Lab Results  Component Value Date   HIV1RNAQUANT <20 DETECTED (A) 04/11/2018   HIV1RNAQUANT <20 DETECTED (A) 01/27/2018   HIV1RNAQUANT <20 DETECTED (A) 04/21/2017   CD4TABS 970 01/27/2018   CD4TABS 880 04/21/2017   CD4TABS 1,070 04/20/2016    RPR and STI Lab Results  Component Value Date   LABRPR NON-REACTIVE 01/27/2018   LABRPR NON-REACTIVE 04/21/2017   LABRPR NON  REAC 04/20/2016   LABRPR NON REAC 01/09/2015   LABRPR NON REAC 04/11/2013    STI Results GC CT  01/27/2018 Negative Negative    Hepatitis B Lab Results  Component Value Date   HEPBSAB POS (A) 12/22/2011   HEPBSAG NEGATIVE 12/22/2011   HEPBCAB POS (A) 12/22/2011   Hepatitis C No results found for: HEPCAB, HCVRNAPCRQN Hepatitis A Lab Results  Component Value Date   HAV POS (A) 12/22/2011   Lipids: Lab Results  Component Value Date   CHOL 159 04/21/2017   TRIG 29 04/21/2017   HDL 90 04/21/2017   CHOLHDL 1.8 04/21/2017   VLDL 12 04/20/2016   LDLCALC 61 04/21/2017    Current HIV Regimen: Odefsey  Assessment: Andrew Farmer is here today for labs and follow-up after switching from Complera to [place].  He is having no issues tolerating Odefsey and cannot tell a difference since switching. No issues with paying for the medication or insurance  changes. Will check a HIV viral load today to be sure he remains undetectable after switching.  He is taking it with food.  Plan: - Continue Odefsey PO once daily with food - F/u with Andrew Farmer 5/13 at 430pm  [name] L. Dorma Altman, PharmD, BCIDP, AAHIVP, CPP Infectious Diseases Clinical Pharmacist [facility] 04/13/2018, 3:11 PM

## 2018-04-13 LAB — HIV-1 RNA QUANT-NO REFLEX-BLD
HIV 1 RNA Quant: <20 copies/mL — AB
HIV-1 RNA Quant, Log: <1.3 Log copies/mL — AB

## 2018-04-20 ENCOUNTER — Ambulatory Visit: Payer: Self-pay

## 2018-04-21 ENCOUNTER — Encounter: Payer: Self-pay | Admitting: Family

## 2018-06-30 ENCOUNTER — Other Ambulatory Visit: Payer: Self-pay | Admitting: Family

## 2018-06-30 DIAGNOSIS — I10 Essential (primary) hypertension: Secondary | ICD-10-CM

## 2018-07-26 ENCOUNTER — Other Ambulatory Visit: Payer: Self-pay | Admitting: Family

## 2018-07-26 DIAGNOSIS — I10 Essential (primary) hypertension: Secondary | ICD-10-CM

## 2018-07-26 DIAGNOSIS — Z21 Asymptomatic human immunodeficiency virus [HIV] infection status: Secondary | ICD-10-CM

## 2018-07-27 ENCOUNTER — Other Ambulatory Visit: Payer: Self-pay

## 2018-07-27 DIAGNOSIS — Z21 Asymptomatic human immunodeficiency virus [HIV] infection status: Secondary | ICD-10-CM

## 2018-07-28 LAB — T-HELPER CELL (CD4) - (RCID CLINIC ONLY)
CD4 % Helper T Cell: 48 % (ref 33–55)
CD4 T Cell Abs: 960 /uL (ref 400–2700)

## 2018-07-30 LAB — COMPREHENSIVE METABOLIC PANEL
AG Ratio: 1.7 (calc) (ref 1.0–2.5)
ALT: 20 U/L (ref 9–46)
AST: 33 U/L (ref 10–35)
Albumin: 4.7 g/dL (ref 3.6–5.1)
Alkaline phosphatase (APISO): 34 U/L — ABNORMAL LOW (ref 35–144)
BUN: 17 mg/dL (ref 7–25)
CO2: 26 mmol/L (ref 20–32)
Calcium: 10.2 mg/dL (ref 8.6–10.3)
Chloride: 103 mmol/L (ref 98–110)
Creat: 1.2 mg/dL (ref 0.70–1.33)
Globulin: 2.7 g/dL (calc) (ref 1.9–3.7)
Glucose, Bld: 89 mg/dL (ref 65–99)
Potassium: 5 mmol/L (ref 3.5–5.3)
Sodium: 136 mmol/L (ref 135–146)
Total Bilirubin: 1.2 mg/dL (ref 0.2–1.2)
Total Protein: 7.4 g/dL (ref 6.1–8.1)

## 2018-07-30 LAB — CBC
HCT: 39.1 % (ref 38.5–50.0)
Hemoglobin: 14 g/dL (ref 13.2–17.1)
MCH: 34.2 pg — ABNORMAL HIGH (ref 27.0–33.0)
MCHC: 35.8 g/dL (ref 32.0–36.0)
MCV: 95.6 fL (ref 80.0–100.0)
MPV: 10 fL (ref 7.5–12.5)
Platelets: 240 10*3/uL (ref 140–400)
RBC: 4.09 10*6/uL — ABNORMAL LOW (ref 4.20–5.80)
RDW: 11.7 % (ref 11.0–15.0)
WBC: 5.7 10*3/uL (ref 3.8–10.8)

## 2018-07-30 LAB — LIPID PANEL
Cholesterol: 191 mg/dL (ref <200)
HDL: 103 mg/dL (ref >=40)
LDL Cholesterol (Calc): 79 mg/dL (calc)
Non-HDL Cholesterol (Calc): 88 mg/dL (calc) (ref <130)
Total CHOL/HDL Ratio: 1.9 (calc) (ref <5.0)
Triglycerides: 33 mg/dL (ref <150)

## 2018-07-30 LAB — HIV-1 RNA QUANT-NO REFLEX-BLD
HIV 1 RNA Quant: <20 copies/mL
HIV-1 RNA Quant, Log: <1.3 Log copies/mL

## 2018-07-30 LAB — RPR: RPR Ser Ql: NONREACTIVE

## 2018-08-16 NOTE — Progress Notes (Signed)
Subjective:    Patient ID: Andrew Farmer, male    DOB: 03/09/1966, 53 y.o.   MRN: [id number]  Chief Complaint  Patient presents with  . HIV Positive/AIDS     Virtual Visit via Telephone Note   I connected with [name]  on 08/17/2018 at 4:30 PM by telephone and verified that I am speaking with the correct person using two identifiers.   I discussed the limitations, risks, security and privacy concerns of performing an evaluation and management service by telephone and the availability of in person appointments. I also discussed with the patient that there may be a patient responsible charge related to this service. The patient expressed understanding and agreed to proceed.   HPI:  Andrew Farmer is a 53 y.o. male last seen in the office on 02/10/2018 with good adherence and tolerance to his ART regimen of Complera and ultimately switched to [facility].  Viral load at the time found to be undetectable with CD4 count of 970.  In the interim he met with pharmacy staff for follow-up on 04/11/2018 with continued undetectable status.  Most recent blood work completed on 07/27/2018 with viral load remaining undetectable and CD4 count of 960.  RPR was nonreactive.  Renal function, liver function, and electrolytes within normal ranges.  Healthcare maintenance due includes Menveo, Prevnar, and colon cancer screening with colonoscopy.  Andrew Farmer has been taking his Andrew Farmer as prescribed with no adverse side effects or missed doses. Not feeling so good today. Ordered pizza last night and believes that he may have a viral stomach bug as he has had acute onset nausea, vomiting and diarrhea today. His fiance has similar symptoms as well. He did take 2 Tums to helpw with his symptoms and immodium to help with the diarrhea.   Andrew Farmer has no problems obtaining/paying for his medications as he remains covered through [facility] and obtains his medications from [facility]. He has had some anxiety recently but denies  feelings of being down, depressed or hopeless. 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 Due for a colon cancer screening. Dental exam up to date.     No Known Allergies    Outpatient Medications Prior to Visit  Medication Sig Dispense Refill  . BENICAR 40 MG tablet TAKE 1 TABLET(40 MG) BY MOUTH DAILY 30 tablet 5  . clonazePAM (KLONOPIN) 0.5 MG tablet Take 1 tablet (0.5 mg total) by mouth 2 (two) times daily as needed for anxiety. PCP 30 tablet 3  . zolpidem (AMBIEN) 10 MG tablet Take 1 tablet (10 mg total) by mouth at bedtime as needed. 30 tablet 3  . ODEFSEY 200-25-25 MG TABS tablet TAKE 1 TABLET BY MOUTH DAILY 30 tablet 0   No facility-administered medications prior to visit.      History reviewed. No pertinent past medical history.   History reviewed. No pertinent surgical history.     Review of Systems  Constitutional: Negative for appetite change, chills, fatigue, fever and unexpected weight change.  Eyes: Negative for visual disturbance.  Respiratory: Negative for cough, chest tightness, shortness of breath and wheezing.   Cardiovascular: Negative for chest pain and leg swelling.  Gastrointestinal: Positive for diarrhea and nausea. Negative for abdominal pain, constipation and vomiting.  Genitourinary: Negative for dysuria, flank pain, frequency, genital sores, hematuria and urgency.  Skin: Negative for rash.  Allergic/Immunologic: Negative for immunocompromised state.  Neurological:  Negative for dizziness and headaches.      Objective:    Nursing note and vital signs reviewed.    Andrew Farmer is pleasant to speak with. His tone sounds to be somber Assessment & Plan:   Problem List Items Addressed This Visit      Digestive   Viral gastroenteritis    Andrew Farmer has signs/symptoms of consistent with viral gastroenteritis likely from food consumed the night before.  Recommend supportive care and symptom management as needed. Continue hydration with water and electrolytes as tolerated. Follow up if symptoms worsen or do not improve.       Relevant Medications   emtricitabine-rilpivir-tenofovir AF (ODEFSEY) 200-25-25 MG TABS tablet     Other   HIV (human immunodeficiency virus infection) ([place]) - Primary    Andrew Farmer has well controlled HIV disease with good adherence and tolerance to his ART regimen of Odefsey.  No signs/symptoms of opportunistic infection or progressive HIV disease.  He has no problems obtaining his medications.  Will need to remain [facility] in July.  Continue current dose of Odefsey.  Plan for follow-up in 6 months or sooner if needed with lab work 1 to 2 weeks prior to appointment.      Relevant Medications   emtricitabine-rilpivir-tenofovir AF (ODEFSEY) 200-25-25 MG TABS tablet   Other Relevant Orders   HIV-1 RNA quant-no reflex-bld   CBC   Comprehensive metabolic panel   T-helper cell (CD4)- ([facility] clinic only)   Healthcare maintenance     Due for Prevnar next office visit  Dental screening up-to-date with most recent exam 2 months ago.  Due for colonoscopy  Discussed importance of safe sexual practice to reduce risk of acquisition/transmission of STI.  Currently in monogamous relationship with fianc.          I have changed Andrew Farmer to emtricitabine-rilpivir-tenofovir AF. I am also having him maintain his clonazePAM, zolpidem, and Benicar.   Meds ordered this encounter  Medications  . emtricitabine-rilpivir-tenofovir AF (ODEFSEY) 200-25-25 MG TABS tablet    Sig: Take 1 tablet by mouth daily.    Dispense:  30 tablet    Refill:  5    Order Specific Question:   Supervising Provider    Answer:   Carlyle Basques [phone]     I discussed the assessment and treatment plan with the patient. The patient was provided an opportunity to ask questions and all were answered. The patient agreed with the plan and  demonstrated an understanding of the instructions.   The patient was advised to call back or seek an in-person evaluation if the symptoms worsen or if the condition fails to improve as anticipated.   I provided 13  minutes of non-face-to-face time during this encounter.  Follow-up: Return in about 6 months (around 02/17/2019), or if symptoms worsen or fail to improve.   Terri Piedra, MSN, FNP-C Nurse Practitioner [facility] for Infectious Disease [place] number: [phone]

## 2018-08-17 ENCOUNTER — Other Ambulatory Visit: Payer: Self-pay

## 2018-08-17 ENCOUNTER — Encounter: Payer: Self-pay | Admitting: Family

## 2018-08-17 ENCOUNTER — Ambulatory Visit (INDEPENDENT_AMBULATORY_CARE_PROVIDER_SITE_OTHER): Payer: Self-pay | Admitting: Family

## 2018-08-17 DIAGNOSIS — A084 Viral intestinal infection, unspecified: Secondary | ICD-10-CM | POA: Insufficient documentation

## 2018-08-17 DIAGNOSIS — Z21 Asymptomatic human immunodeficiency virus [HIV] infection status: Secondary | ICD-10-CM

## 2018-08-17 DIAGNOSIS — Z0000 Encounter for general adult medical examination without abnormal findings: Secondary | ICD-10-CM | POA: Insufficient documentation

## 2018-08-17 HISTORY — DX: Viral intestinal infection, unspecified: A08.4

## 2018-08-17 MED ORDER — EMTRICITAB-RILPIVIR-TENOFOV AF 200-25-25 MG PO TABS: 1.0000 | ORAL_TABLET | Freq: Every day | ORAL | 5 refills | Status: DC

## 2018-08-17 NOTE — Patient Instructions (Signed)
Nice to speak with you.  Continue to take your [facility] as prescribed.   Hoping you feel better soon.  Plan for follow up in 6 months or sooner if needed.  Please plan to renew your UMAP in July.   Have a great day and stay safe.

## 2018-08-17 NOTE — Assessment & Plan Note (Signed)
   Due for Prevnar next office visit  Dental screening up-to-date with most recent exam 2 months ago.  Due for colonoscopy  Discussed importance of safe sexual practice to reduce risk of acquisition/transmission of STI.  Currently in monogamous relationship with fianc.

## 2018-08-17 NOTE — Assessment & Plan Note (Signed)
Andrew Farmer has signs/symptoms of consistent with viral gastroenteritis likely from food consumed the night before. Recommend supportive care and symptom management as needed. Continue hydration with water and electrolytes as tolerated. Follow up if symptoms worsen or do not improve.

## 2018-08-17 NOTE — Assessment & Plan Note (Signed)
Mr. Alioto has well controlled HIV disease with good adherence and tolerance to his ART regimen of Odefsey.  No signs/symptoms of opportunistic infection or progressive HIV disease.  He has no problems obtaining his medications.  Will need to remain [facility]MAP in July.  Continue current dose of Odefsey.  Plan for follow-up in 6 months or sooner if needed with lab work 1 to 2 weeks prior to appointment.

## 2018-09-10 ENCOUNTER — Other Ambulatory Visit: Payer: Self-pay | Admitting: Infectious Diseases

## 2018-09-10 DIAGNOSIS — B20 Human immunodeficiency virus [HIV] disease: Secondary | ICD-10-CM

## 2018-09-10 DIAGNOSIS — F418 Other specified anxiety disorders: Secondary | ICD-10-CM

## 2018-09-12 ENCOUNTER — Telehealth: Payer: Self-pay | Admitting: *Deleted

## 2018-09-12 DIAGNOSIS — F418 Other specified anxiety disorders: Secondary | ICD-10-CM

## 2018-09-12 DIAGNOSIS — B20 Human immunodeficiency virus [HIV] disease: Secondary | ICD-10-CM

## 2018-09-12 DIAGNOSIS — Z21 Asymptomatic human immunodeficiency virus [HIV] infection status: Secondary | ICD-10-CM

## 2018-09-12 MED ORDER — CLONAZEPAM 0.5 MG PO TABS: 0.5000 mg | ORAL_TABLET | Freq: Two times a day (BID) | ORAL | 2 refills | Status: DC | PRN

## 2018-09-12 MED ORDER — ZOLPIDEM TARTRATE 10 MG PO TABS: 10.0000 mg | ORAL_TABLET | Freq: Every evening | ORAL | 2 refills | Status: DC | PRN

## 2018-09-12 NOTE — Addendum Note (Signed)
Addended by: Mauricio Po D on: 09/12/2018 03:36 PM   Modules accepted: Orders

## 2018-09-12 NOTE — Telephone Encounter (Signed)
Received refill request for klonopin and ambien.  Please advise. Landis Gandy, RN

## 2018-09-12 NOTE — Telephone Encounter (Signed)
Medications have been refilled and sent to the pharmacy. 

## 2018-09-13 MED ORDER — CLONAZEPAM 0.5 MG PO TABS: 0.5000 mg | ORAL_TABLET | Freq: Two times a day (BID) | ORAL | 2 refills | Status: DC | PRN

## 2018-09-13 MED ORDER — ZOLPIDEM TARTRATE 10 MG PO TABS: 10.0000 mg | ORAL_TABLET | Freq: Every evening | ORAL | 2 refills | Status: DC | PRN

## 2018-10-28 ENCOUNTER — Encounter: Payer: Self-pay | Admitting: Infectious Diseases

## 2018-12-28 ENCOUNTER — Other Ambulatory Visit: Payer: Self-pay | Admitting: Family

## 2018-12-28 DIAGNOSIS — I10 Essential (primary) hypertension: Secondary | ICD-10-CM

## 2019-01-24 ENCOUNTER — Other Ambulatory Visit: Payer: Self-pay | Admitting: Family

## 2019-01-24 DIAGNOSIS — Z21 Asymptomatic human immunodeficiency virus [HIV] infection status: Secondary | ICD-10-CM

## 2019-02-08 ENCOUNTER — Other Ambulatory Visit: Payer: Self-pay | Admitting: Family

## 2019-02-08 DIAGNOSIS — B20 Human immunodeficiency virus [HIV] disease: Secondary | ICD-10-CM

## 2019-02-08 DIAGNOSIS — F418 Other specified anxiety disorders: Secondary | ICD-10-CM

## 2019-02-09 ENCOUNTER — Telehealth: Payer: Self-pay | Admitting: *Deleted

## 2019-02-09 ENCOUNTER — Other Ambulatory Visit: Payer: Self-pay | Admitting: Family

## 2019-02-09 DIAGNOSIS — F418 Other specified anxiety disorders: Secondary | ICD-10-CM

## 2019-02-09 DIAGNOSIS — B20 Human immunodeficiency virus [HIV] disease: Secondary | ICD-10-CM

## 2019-02-09 MED ORDER — CLONAZEPAM 0.5 MG PO TABS: 0.5000 mg | ORAL_TABLET | Freq: Two times a day (BID) | ORAL | 0 refills | Status: DC | PRN

## 2019-02-09 MED ORDER — ZOLPIDEM TARTRATE 10 MG PO TABS: 10.0000 mg | ORAL_TABLET | Freq: Every evening | ORAL | 0 refills | Status: DC | PRN

## 2019-02-09 NOTE — Telephone Encounter (Signed)
Patient is requesting refills of his klonopin and Lorrin Mais, would like [facility] sent [facility] [place]. Rn scheduled patient for 6 month follow up (labs and flu shot in person, evisit with Marya Amsler at patient's request) 11/11 and 11/25. Landis Gandy, RN

## 2019-02-09 NOTE — Telephone Encounter (Signed)
30 day refill of medication sent.

## 2019-02-15 ENCOUNTER — Ambulatory Visit (INDEPENDENT_AMBULATORY_CARE_PROVIDER_SITE_OTHER): Payer: Self-pay

## 2019-02-15 ENCOUNTER — Other Ambulatory Visit: Payer: Self-pay

## 2019-02-15 DIAGNOSIS — Z21 Asymptomatic human immunodeficiency virus [HIV] infection status: Secondary | ICD-10-CM

## 2019-02-15 DIAGNOSIS — Z23 Encounter for immunization: Secondary | ICD-10-CM

## 2019-02-16 LAB — T-HELPER CELL (CD4) - (RCID CLINIC ONLY)
CD4 % Helper T Cell: 53 % (ref 33–65)
CD4 T Cell Abs: 809 /uL (ref 400–1790)

## 2019-02-19 LAB — COMPREHENSIVE METABOLIC PANEL
AG Ratio: 1.9 (calc) (ref 1.0–2.5)
ALT: 21 U/L (ref 9–46)
AST: 41 U/L — ABNORMAL HIGH (ref 10–35)
Albumin: 4.8 g/dL (ref 3.6–5.1)
Alkaline phosphatase (APISO): 35 U/L (ref 35–144)
BUN: 16 mg/dL (ref 7–25)
CO2: 27 mmol/L (ref 20–32)
Calcium: 10.2 mg/dL (ref 8.6–10.3)
Chloride: 102 mmol/L (ref 98–110)
Creat: 1.03 mg/dL (ref 0.70–1.33)
Globulin: 2.5 g/dL (calc) (ref 1.9–3.7)
Glucose, Bld: 80 mg/dL (ref 65–99)
Potassium: 4.5 mmol/L (ref 3.5–5.3)
Sodium: 138 mmol/L (ref 135–146)
Total Bilirubin: 1 mg/dL (ref 0.2–1.2)
Total Protein: 7.3 g/dL (ref 6.1–8.1)

## 2019-02-19 LAB — CBC
HCT: 39.8 % (ref 38.5–50.0)
Hemoglobin: 13.9 g/dL (ref 13.2–17.1)
MCH: 34.5 pg — ABNORMAL HIGH (ref 27.0–33.0)
MCHC: 34.9 g/dL (ref 32.0–36.0)
MCV: 98.8 fL (ref 80.0–100.0)
MPV: 10.8 fL (ref 7.5–12.5)
Platelets: 179 10*3/uL (ref 140–400)
RBC: 4.03 10*6/uL — ABNORMAL LOW (ref 4.20–5.80)
RDW: 11.9 % (ref 11.0–15.0)
WBC: 4.7 10*3/uL (ref 3.8–10.8)

## 2019-02-19 LAB — HIV-1 RNA QUANT-NO REFLEX-BLD
HIV 1 RNA Quant: <20 copies/mL
HIV-1 RNA Quant, Log: <1.3 Log copies/mL

## 2019-02-23 ENCOUNTER — Other Ambulatory Visit: Payer: Self-pay | Admitting: Family

## 2019-02-23 DIAGNOSIS — I10 Essential (primary) hypertension: Secondary | ICD-10-CM

## 2019-03-01 ENCOUNTER — Ambulatory Visit (INDEPENDENT_AMBULATORY_CARE_PROVIDER_SITE_OTHER): Payer: Self-pay | Admitting: Family

## 2019-03-01 ENCOUNTER — Other Ambulatory Visit: Payer: Self-pay

## 2019-03-01 ENCOUNTER — Encounter: Payer: Self-pay | Admitting: Family

## 2019-03-01 DIAGNOSIS — Z0000 Encounter for general adult medical examination without abnormal findings: Secondary | ICD-10-CM

## 2019-03-01 DIAGNOSIS — Z21 Asymptomatic human immunodeficiency virus [HIV] infection status: Secondary | ICD-10-CM

## 2019-03-01 DIAGNOSIS — Z720 Tobacco use: Secondary | ICD-10-CM

## 2019-03-01 DIAGNOSIS — F418 Other specified anxiety disorders: Secondary | ICD-10-CM

## 2019-03-01 NOTE — Assessment & Plan Note (Signed)
Continues to smoke tobacco which has increased recently due to his increased stressors.  Discussed importance of tobacco cessation to reduce risk of cardiovascular, malignant, and respiratory disease in the future.  Currently in the precontemplation stage of quitting and not ready to quit at this time.  Continue to monitor.

## 2019-03-01 NOTE — Assessment & Plan Note (Signed)
Andrew Farmer has increasing levels of anxiety related to several stressors including a friend recently diagnosed with cancer.  Symptoms are adequately managed with current dose of clonazepam as needed taking the medication once every other day on average.  Discussed importance of stress relief.  Offered counseling and will provide resources after visit summary.

## 2019-03-01 NOTE — Assessment & Plan Note (Signed)
Andrew Farmer has well-controlled HIV disease with good adherence and tolerance to his ART regimen of Odefsey.  No signs/symptoms of opportunistic infection or progressive HIV disease.  We reviewed lab work and discussed plan of care.  Continue current dose of Odefsey.  Plan for follow-up in 6 months or sooner if needed with lab work 1 to 2 weeks prior to appointment and may be a video visit if necessary.

## 2019-03-01 NOTE — Patient Instructions (Signed)
Nice to speak with you today.  Please continue to take your [facility] as prescribed daily.  Refills are available at the pharmacy.  Continue to use the clonazepam as needed.  Counseling services are available through [facility] -call to make an appointment as needed.  Please let us know if you have any questions/concerns.  Plan for follow-up in 6 months or sooner if needed with lab work 1 to 2 weeks prior to appointment.  May consider video visit if indicated  Have a great day and stay safe!

## 2019-03-01 NOTE — Progress Notes (Signed)
Subjective:    Patient ID: Andrew Farmer, male    DOB: [birth year], 53 y.o.   MRN: [id number]  No chief complaint on file.    Virtual Visit via Telephone Note   I connected with [name] on 03/01/2019 at 3:15  by telephone and verified that I am speaking with the correct person using two identifiers.   I discussed the limitations, risks, security and privacy concerns of performing an evaluation and management service by telephone and the availability of in person appointments. I also discussed with the patient that there may be a patient responsible charge related to this service. The patient expressed understanding and agreed to proceed.   HPI:  Andrew Farmer is a 53 y.o. male with HIV disease who was last seen in the office on 08/17/2018 with good adherence and tolerance to his ART regimen of Odefsey.  Blood work at the time showed a CD4 count of 960 with a viral load that was undetectable.  Most recent blood work completed on 02/15/2019 with viral load that remains undetectable and CD4 count of 809.  Influenza vaccination provided on 02/15/2019.  Healthcare maintenance due includes Prevnar and colonoscopy for colon cancer screening.  Andrew Farmer continues to take his Charlett Lango as prescribed no adverse side effects or missed doses. Overall doing okay. Having some increasing levels of anxiety and recently had a friend who has been diagnosed with cancer and has been complicated with the coronavirus pandemic as he wants to be there for her. Denies fevers, chills, night sweats, headaches, changes in vision, neck pain/stiffness, nausea, diarrhea, vomiting, lesions or rashes.  Andrew Farmer has no problems obtaining his medications from the pharmacy. Does have increasing anxiety. Continues to smoke tobacco and looking to quit. Has been eating out more often. Alcohol consumption is down. Currently sexually active with his partner who is on PrEP.     No Known Allergies    Outpatient Medications  Prior to Visit  Medication Sig Dispense Refill   BENICAR 40 MG tablet TAKE 1 TABLET(40 MG) BY MOUTH DAILY 30 tablet 0   clonazePAM (KLONOPIN) 0.5 MG tablet Take 1 tablet (0.5 mg total) by mouth 2 (two) times daily as needed for anxiety. 30 tablet 0   ODEFSEY 200-25-25 MG TABS tablet TAKE 1 TABLET BY MOUTH DAILY 30 tablet 5   zolpidem (AMBIEN) 10 MG tablet Take 1 tablet (10 mg total) by mouth at bedtime as needed. 30 tablet 0   No facility-administered medications prior to visit.      History reviewed. No pertinent past medical history.   History reviewed. No pertinent surgical history.     Review of Systems  Constitutional: Negative for appetite change, chills, fatigue, fever and unexpected weight change.  Eyes: Negative for visual disturbance.  Respiratory: Negative for cough, chest tightness, shortness of breath and wheezing.   Cardiovascular: Negative for chest pain and leg swelling.  Gastrointestinal: Negative for abdominal pain, constipation, diarrhea, nausea and vomiting.  Genitourinary: Negative for dysuria, flank pain, frequency, genital sores, hematuria and urgency.  Skin: Negative for rash.  Allergic/Immunologic: Negative for immunocompromised state.  Neurological: Negative for dizziness and headaches.  Psychiatric/Behavioral: The patient is nervous/anxious.       Objective:    Nursing note and vital signs reviewed.    Andrew Farmer sounds to be doing well and is pleasant to speak with. He has a several stressors as of late which are increasing his levels of anxiety.  Assessment & Plan:  Problem List Items Addressed This Visit      Other   Tobacco abuse (Chronic)    Continues to smoke tobacco which has increased recently due to his increased stressors.  Discussed importance of tobacco cessation to reduce risk of cardiovascular, malignant, and respiratory disease in the future.  Currently in the precontemplation stage of quitting and not ready to quit at this  time.  Continue to monitor.      Depression with anxiety    Andrew Farmer has increasing levels of anxiety related to several stressors including a friend recently diagnosed with cancer.  Symptoms are adequately managed with current dose of clonazepam as needed taking the medication once every other day on average.  Discussed importance of stress relief.  Offered counseling and will provide resources after visit summary.      HIV (human immunodeficiency virus infection) [facility])    Andrew Farmer has well-controlled HIV disease with good adherence and tolerance to his ART regimen of Odefsey.  No signs/symptoms of opportunistic infection or progressive HIV disease.  We reviewed lab work and discussed plan of care.  Continue current dose of Odefsey.  Plan for follow-up in 6 months or sooner if needed with lab work 1 to 2 weeks prior to appointment and may be a video visit if necessary.      Healthcare maintenance     Due for colon cancer screening through colonoscopy.  Discussed importance of safe sexual practice to reduce risk of STI.  Partner on PrEP          I am having Andrew Farmer maintain his Odefsey, clonazePAM, zolpidem, and Benicar.   I discussed the assessment and treatment plan with the patient. The patient was provided an opportunity to ask questions and all were answered. The patient agreed with the plan and demonstrated an understanding of the instructions.   The patient was advised to call back or seek an in-person evaluation if the symptoms worsen or if the condition fails to improve as anticipated.   I provided  14  minutes of non-face-to-face time during this encounter.  Follow-up: Return in about 6 months (around 08/29/2019), or if symptoms worsen or fail to improve.   Terri Piedra, MSN, FNP-C Nurse Practitioner [facility] for Infectious Disease [place] number: [phone]

## 2019-03-01 NOTE — Assessment & Plan Note (Signed)
   Due for colon cancer screening through colonoscopy.  Discussed importance of safe sexual practice to reduce risk of STI.  Partner on PrEP

## 2019-03-21 ENCOUNTER — Other Ambulatory Visit: Payer: Self-pay | Admitting: Family

## 2019-03-21 DIAGNOSIS — I10 Essential (primary) hypertension: Secondary | ICD-10-CM

## 2019-05-11 ENCOUNTER — Encounter: Payer: Self-pay | Admitting: Family

## 2019-08-07 ENCOUNTER — Other Ambulatory Visit: Payer: Self-pay

## 2019-08-07 DIAGNOSIS — Z113 Encounter for screening for infections with a predominantly sexual mode of transmission: Secondary | ICD-10-CM

## 2019-08-07 DIAGNOSIS — B20 Human immunodeficiency virus [HIV] disease: Secondary | ICD-10-CM

## 2019-08-07 DIAGNOSIS — Z79899 Other long term (current) drug therapy: Secondary | ICD-10-CM

## 2019-08-09 ENCOUNTER — Other Ambulatory Visit: Payer: Self-pay

## 2019-08-09 DIAGNOSIS — Z79899 Other long term (current) drug therapy: Secondary | ICD-10-CM

## 2019-08-09 DIAGNOSIS — Z113 Encounter for screening for infections with a predominantly sexual mode of transmission: Secondary | ICD-10-CM

## 2019-08-09 DIAGNOSIS — B20 Human immunodeficiency virus [HIV] disease: Secondary | ICD-10-CM

## 2019-08-10 LAB — T-HELPER CELL (CD4) - (RCID CLINIC ONLY)
CD4 % Helper T Cell: 45 % (ref 33–65)
CD4 T Cell Abs: 836 /uL (ref 400–1790)

## 2019-08-11 LAB — CBC WITH DIFFERENTIAL/PLATELET
Absolute Monocytes: 513 cells/uL (ref 200–950)
Basophils Absolute: 38 cells/uL (ref 0–200)
Basophils Relative: 0.7 %
Eosinophils Absolute: 32 cells/uL (ref 15–500)
Eosinophils Relative: 0.6 %
HCT: 40 % (ref 38.5–50.0)
Hemoglobin: 14.3 g/dL (ref 13.2–17.1)
Lymphs Abs: 1836 cells/uL (ref 850–3900)
MCH: 34 pg — ABNORMAL HIGH (ref 27.0–33.0)
MCHC: 35.8 g/dL (ref 32.0–36.0)
MCV: 95.2 fL (ref 80.0–100.0)
MPV: 10 fL (ref 7.5–12.5)
Monocytes Relative: 9.5 %
Neutro Abs: 2981 cells/uL (ref 1500–7800)
Neutrophils Relative %: 55.2 %
Platelets: 197 10*3/uL (ref 140–400)
RBC: 4.2 10*6/uL (ref 4.20–5.80)
RDW: 12.4 % (ref 11.0–15.0)
Total Lymphocyte: 34 %
WBC: 5.4 10*3/uL (ref 3.8–10.8)

## 2019-08-11 LAB — COMPLETE METABOLIC PANEL WITH GFR
AG Ratio: 1.7 (calc) (ref 1.0–2.5)
ALT: 24 U/L (ref 9–46)
AST: 41 U/L — ABNORMAL HIGH (ref 10–35)
Albumin: 4.7 g/dL (ref 3.6–5.1)
Alkaline phosphatase (APISO): 37 U/L (ref 35–144)
BUN: 14 mg/dL (ref 7–25)
CO2: 22 mmol/L (ref 20–32)
Calcium: 10.2 mg/dL (ref 8.6–10.3)
Chloride: 104 mmol/L (ref 98–110)
Creat: 1.31 mg/dL (ref 0.70–1.33)
GFR, Est African American: 72 mL/min/{1.73_m2} (ref >=60)
GFR, Est Non African American: 62 mL/min/{1.73_m2} (ref >=60)
Globulin: 2.7 g/dL (calc) (ref 1.9–3.7)
Glucose, Bld: 85 mg/dL (ref 65–99)
Potassium: 4.9 mmol/L (ref 3.5–5.3)
Sodium: 136 mmol/L (ref 135–146)
Total Bilirubin: 1.1 mg/dL (ref 0.2–1.2)
Total Protein: 7.4 g/dL (ref 6.1–8.1)

## 2019-08-11 LAB — RPR: RPR Ser Ql: NONREACTIVE

## 2019-08-11 LAB — LIPID PANEL
Cholesterol: 210 mg/dL — ABNORMAL HIGH (ref <200)
HDL: 108 mg/dL (ref >=40)
LDL Cholesterol (Calc): 89 mg/dL (calc)
Non-HDL Cholesterol (Calc): 102 mg/dL (calc) (ref <130)
Total CHOL/HDL Ratio: 1.9 (calc) (ref <5.0)
Triglycerides: 44 mg/dL (ref <150)

## 2019-08-11 LAB — HIV-1 RNA QUANT-NO REFLEX-BLD
HIV 1 RNA Quant: <20 copies/mL
HIV-1 RNA Quant, Log: <1.3 Log copies/mL

## 2019-08-23 ENCOUNTER — Other Ambulatory Visit: Payer: Self-pay

## 2019-08-23 ENCOUNTER — Telehealth (INDEPENDENT_AMBULATORY_CARE_PROVIDER_SITE_OTHER): Payer: Self-pay | Admitting: Family

## 2019-08-23 DIAGNOSIS — Z0000 Encounter for general adult medical examination without abnormal findings: Secondary | ICD-10-CM

## 2019-08-23 DIAGNOSIS — I10 Essential (primary) hypertension: Secondary | ICD-10-CM

## 2019-08-23 DIAGNOSIS — Z21 Asymptomatic human immunodeficiency virus [HIV] infection status: Secondary | ICD-10-CM

## 2019-08-23 MED ORDER — ODEFSEY 200-25-25 MG PO TABS: 1.0000 | ORAL_TABLET | Freq: Every day | ORAL | 5 refills | Status: DC

## 2019-08-23 MED ORDER — OLMESARTAN MEDOXOMIL 40 MG PO TABS: ORAL_TABLET | ORAL | 5 refills | Status: DC

## 2019-08-23 NOTE — Progress Notes (Signed)
Subjective:    Patient ID: Andrew Farmer, male    DOB: [birth year], 54 y.o.   MRN: [id number]  Chief Complaint  Patient presents with  . HIV Positive/AIDS     Virtual Visit via Telephone Note   I connected with Andrew Farmer on 08/24/2019 at 4:!5 pm  by Caregility and verified that I am speaking with the correct person using two identifiers.   I discussed the limitations, risks, security and privacy concerns of performing an evaluation and management service by telephone and the availability of in person appointments. I also discussed with the patient that there may be a patient responsible charge related to this service. The patient expressed understanding and agreed to proceed.   HPI:  Andrew Farmer is a 54 y.o. male with HIV disease who was last seen in the office on 03/01/2019 with good adherence and tolerance to his ART regimen of Odefsey.  Blood work showed a viral load that was undetectable and CD4 count of 809.  Most recent blood work completed on 08/09/2019 with viral load that remains undetectable and CD4 count of 836.  RPR was nonreactive for syphilis.  Renal function, hepatic function, and electrolytes within normal ranges.  Andrew Farmer continues to take his Charlett Lango as prescribed with no adverse side effects or missed doses since his last office visit.  Overall feeling well with no new concerns/complaints. Denies fevers, chills, night sweats, headaches, changes in vision, neck pain/stiffness, nausea, diarrhea, vomiting, lesions or rashes.  Andrew Farmer has no problems obtaining his medication from the pharmacy and it has financial coverage through [employer].  No feelings of being down, depressed, or hopeless recently.  No recreational or illicit drug use or alcohol consumption.  Continues to smoke approximately 1 pack of cigarettes per day on average.  He has received his [employer] for [employer].   No Known Allergies    Outpatient Medications Prior to Visit  Medication Sig Dispense  Refill  . clonazePAM (KLONOPIN) 0.5 MG tablet Take 1 tablet (0.5 mg total) by mouth 2 (two) times daily as needed for anxiety. 30 tablet 0  . zolpidem (AMBIEN) 10 MG tablet Take 1 tablet (10 mg total) by mouth at bedtime as needed. 30 tablet 0  . BENICAR 40 MG tablet TAKE 1 TABLET(40 MG) BY MOUTH DAILY 30 tablet 5  . ODEFSEY 200-25-25 MG TABS tablet TAKE 1 TABLET BY MOUTH DAILY 30 tablet 5   No facility-administered medications prior to visit.     History reviewed. No pertinent past medical history.   History reviewed. No pertinent surgical history.     Review of Systems  Constitutional: Negative for appetite change, chills, fatigue, fever and unexpected weight change.  Eyes: Negative for visual disturbance.  Respiratory: Negative for cough, chest tightness, shortness of breath and wheezing.   Cardiovascular: Negative for chest pain and leg swelling.  Gastrointestinal: Negative for abdominal pain, constipation, diarrhea, nausea and vomiting.  Genitourinary: Negative for dysuria, flank pain, frequency, genital sores, hematuria and urgency.  Skin: Negative for rash.  Allergic/Immunologic: Negative for immunocompromised state.  Neurological: Negative for dizziness and headaches.      Objective:    Nursing note and vital signs reviewed.    Andrew Farmer is pleasant to speak with and sounds to be doing well.  Physical exam limited secondary to virtual visit. Assessment & Plan:   Problem List Items Addressed This Visit      Cardiovascular and Mediastinum   Essential hypertension   Relevant Medications  olmesartan (BENICAR) 40 MG tablet     Other   HIV (human immunodeficiency virus infection) ([place])   Relevant Medications   emtricitabine-rilpivir-tenofovir AF (ODEFSEY) 200-25-25 MG TABS tablet       I have changed Andrew Farmer's Benicar to olmesartan. I have also changed his Odefsey. I am also having him maintain his clonazePAM and zolpidem.   Meds ordered this encounter    Medications  . emtricitabine-rilpivir-tenofovir AF (ODEFSEY) 200-25-25 MG TABS tablet    Sig: Take 1 tablet by mouth daily.    Dispense:  30 tablet    Refill:  5    Order Specific Question:   Supervising Provider    Answer:   Carlyle Basques [[id number]]  . olmesartan (BENICAR) 40 MG tablet    Sig: TAKE 1 TABLET(40 MG) BY MOUTH DAILY    Dispense:  30 tablet    Refill:  5    Order Specific Question:   Supervising Provider    Answer:   Carlyle Basques [phone]     I discussed the assessment and treatment plan with the patient. The patient was provided an opportunity to ask questions and all were answered. The patient agreed with the plan and demonstrated an understanding of the instructions.   The patient was advised to call back or seek an in-person evaluation if the symptoms worsen or if the condition fails to improve as anticipated.   I provided 12   minutes of non-face-to-face time during this encounter.  Follow-up: Return in about 6 months (around 02/23/2020), or if symptoms worsen or fail to improve.   Terri Piedra, MSN, FNP-C Nurse Practitioner [facility] for Infectious Disease [place] number: [phone]

## 2019-08-24 ENCOUNTER — Encounter: Payer: Self-pay | Admitting: Family

## 2019-08-24 NOTE — Assessment & Plan Note (Signed)
   Covid vaccination up-to-date per recommendations.  Discussed importance of safe sexual practice to reduce risk of STI.

## 2019-08-24 NOTE — Assessment & Plan Note (Signed)
Blood pressure remained stable.  Encouraged to monitor blood pressure at home and follow low-sodium diet.  Continue current dose of Benicar.

## 2019-08-24 NOTE — Patient Instructions (Signed)
Nice to speak with you.  Continue to take your [facility] as prescribed.  Refills will be sent to the pharmacy.  Plan for follow-up in 6 months or sooner if needed with lab work 1 to 2 weeks prior to appointment.

## 2019-11-15 ENCOUNTER — Ambulatory Visit: Payer: Self-pay

## 2019-11-15 ENCOUNTER — Other Ambulatory Visit: Payer: Self-pay

## 2019-12-18 ENCOUNTER — Encounter: Payer: Self-pay | Admitting: Family

## 2019-12-18 IMAGING — US US ABDOMEN COMPLETE W/ ELASTOGRAPHY
1 series · 13 of 25 positions shown · non-contrast
Comparison: None.

CLINICAL DATA: Elevated liver enzymes



[Series 1: us abdomen complete w/ elastography · 0.15mm/px · 13 of 116 slices shown]
[im 1/116]
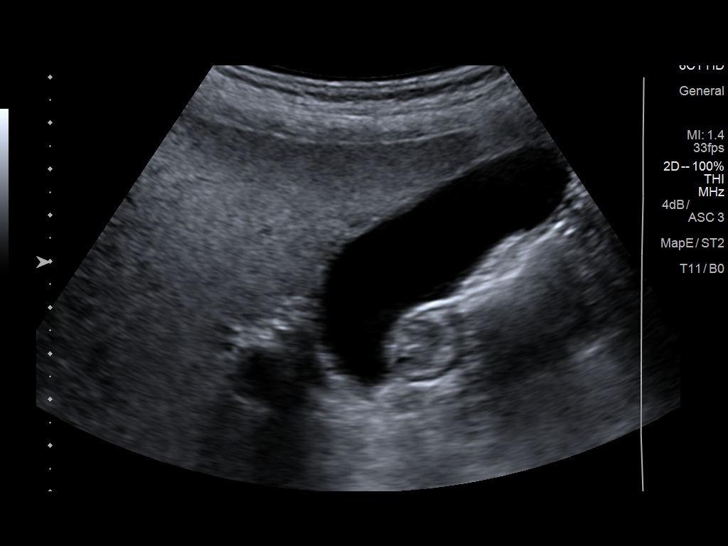
[im 10/116]
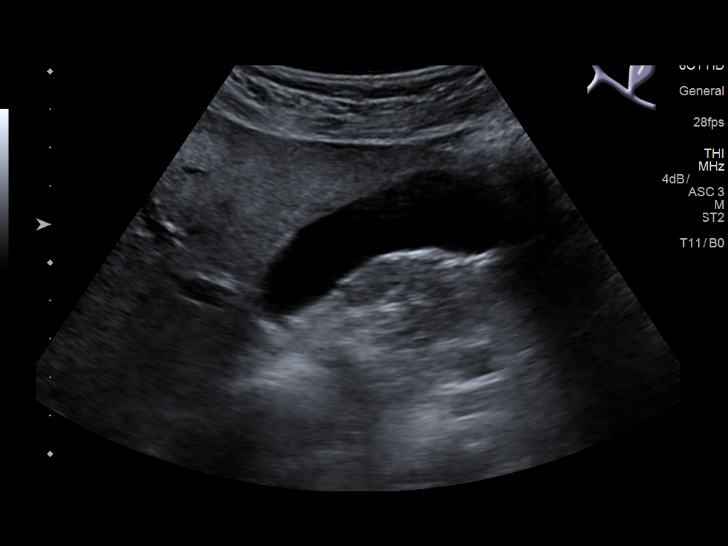
[im 20/116]
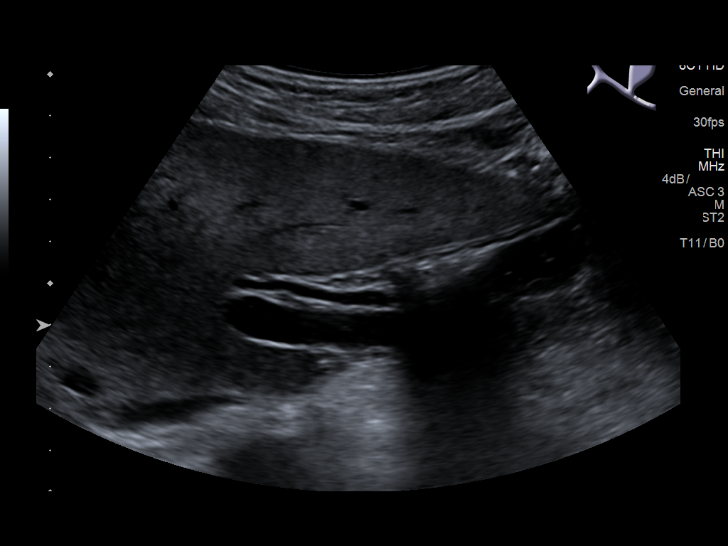
[im 29/116]
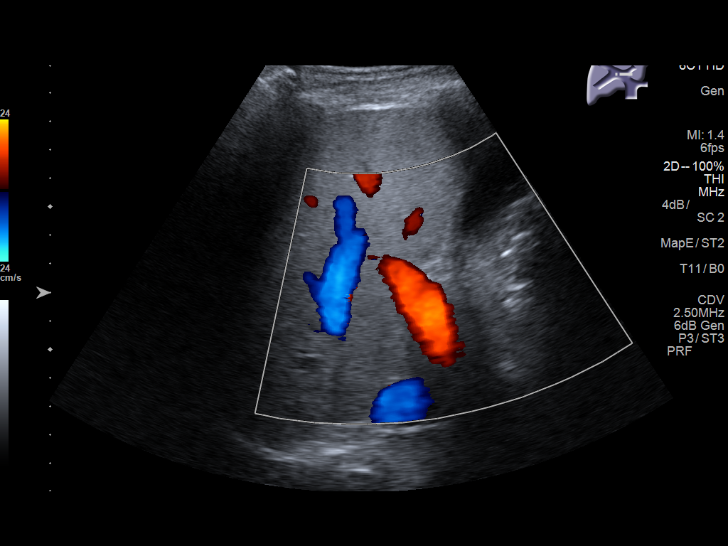
[im 39/116]
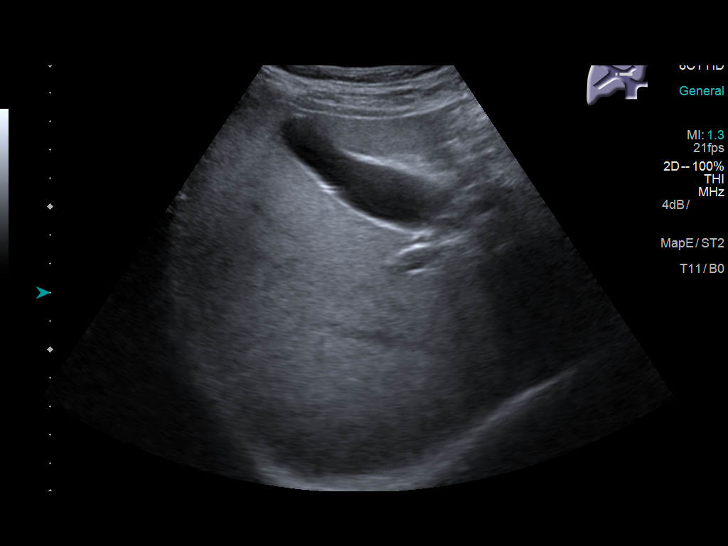
[im 48/116]
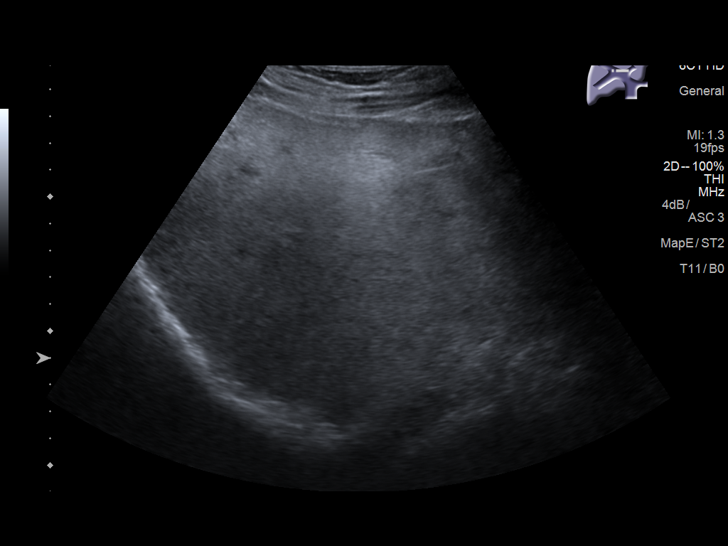
[im 58/116]
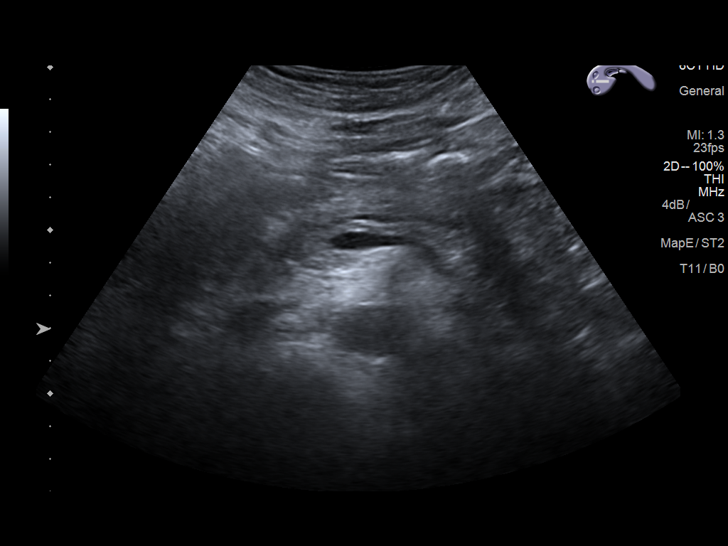
[im 68/116]
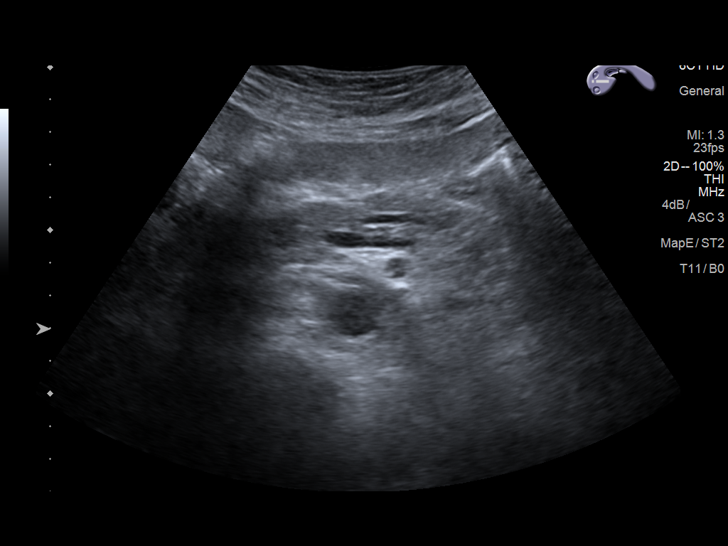
[im 77/116]
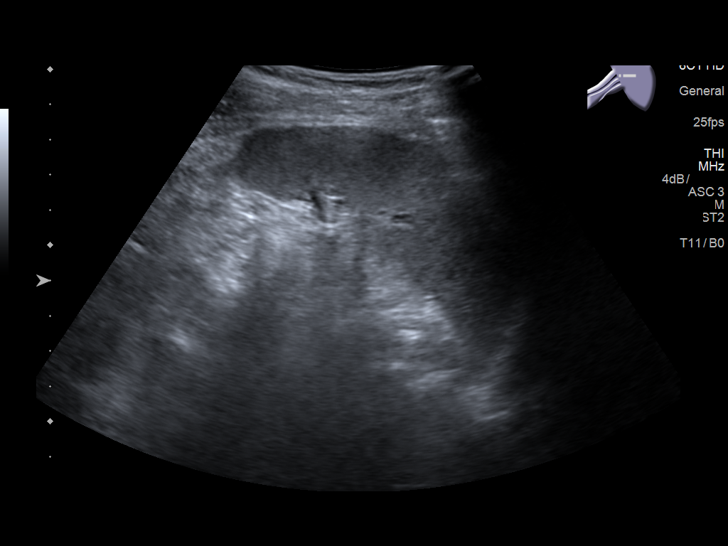
[im 87/116]
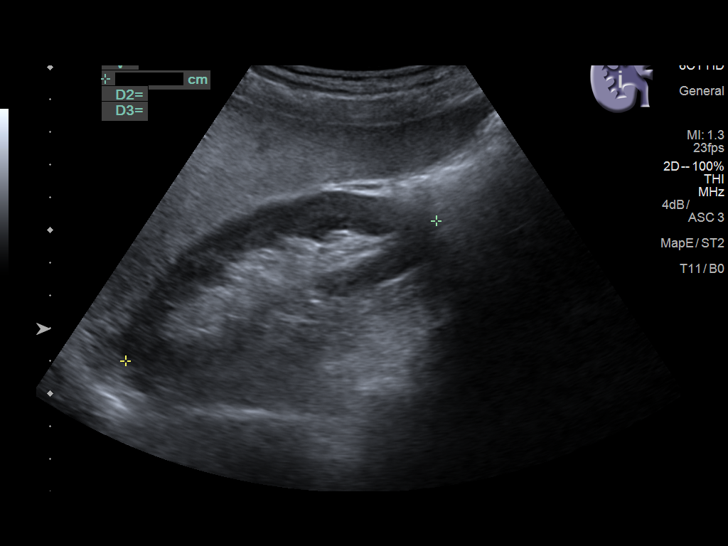
[im 96/116]
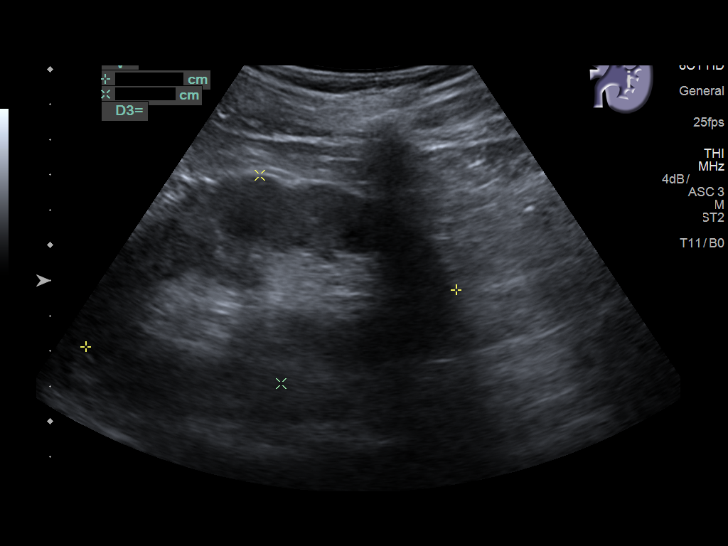
[im 106/116]
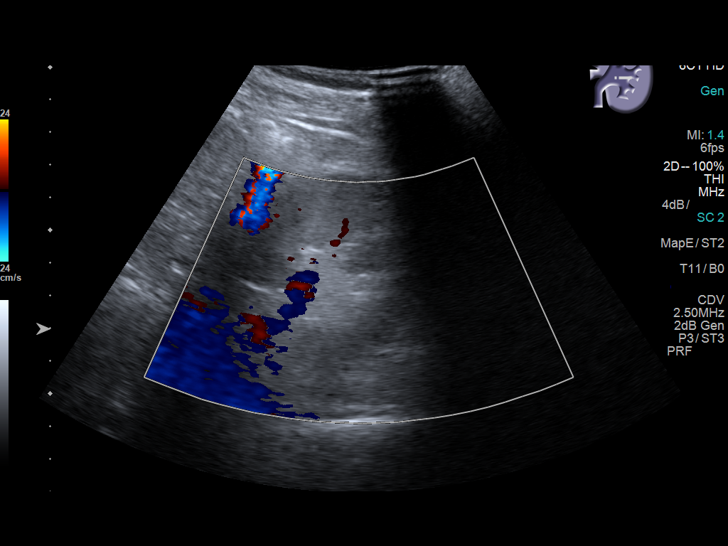
[im 116/116]
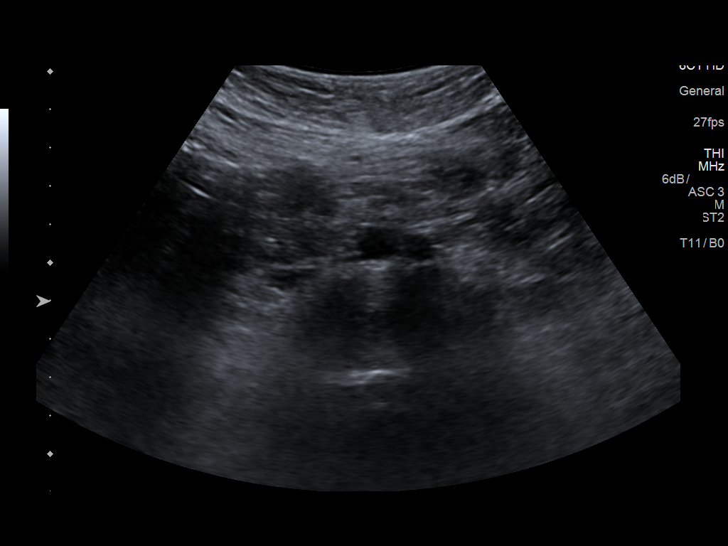

[13 of 25 positions shown; findings below may reference images not displayed]

FINDINGS: ULTRASOUND ABDOMEN

Gallbladder: No gallstones or wall thickening visualized. No
sonographic Murphy sign noted by sonographer.

Common bile duct: Diameter: Normal caliber, ranging from 3-7 mm.

Liver: Increased echotexture compatible with fatty infiltration. No
focal abnormality or biliary ductal dilatation. Portal vein is
patent on color Doppler imaging with normal direction of blood flow
towards the liver.

IVC: No abnormality visualized.

Pancreas: Visualized portion unremarkable.

Spleen: Size and appearance within normal limits.

Right Kidney: Length: 10.4 cm. Echogenicity within normal limits. No
mass or hydronephrosis visualized.

Left Kidney: Length: 10.6 cm. Echogenicity within normal limits. No
mass or hydronephrosis visualized.

Abdominal aorta: No aneurysm visualized.

Other findings: None.

ULTRASOUND HEPATIC ELASTOGRAPHY

Device: Siemens Helix VTQ

Patient position: Left lateral decubitus

Transducer [id number]

Number of measurements: 10

Hepatic segment:  8

Median velocity:   1.35 m/sec

IQR:

IQR/Median velocity ratio:

Corresponding Metavir fibrosis score:  F2 + some F3

Risk of fibrosis: Moderate

Limitations of exam: None

Please note that abnormal shear wave velocities may also be
identified in clinical settings other than with hepatic fibrosis,
such as: acute hepatitis, elevated right heart and central venous
pressures including use of beta blockers, Fong disease
(Thao), infiltrative processes such as
mastocytosis/amyloidosis/infiltrative tumor, extrahepatic
cholestasis, in the post-prandial state, and liver transplantation.
Correlation with patient history, laboratory data, and clinical
condition recommended.
IMPRESSION: ULTRASOUND ABDOMEN: Fatty infiltration of the liver.

ULTRASOUND HEPATIC ELASTOGRAPHY:

Median hepatic shear wave velocity is calculated at 1.35 m/sec.

Corresponding Metavir fibrosis score is F2 + some F3.

Risk of fibrosis is Moderate.

Follow-up: Additional testing appropriate

## 2019-12-28 ENCOUNTER — Other Ambulatory Visit: Payer: Self-pay | Admitting: Family

## 2019-12-28 DIAGNOSIS — F418 Other specified anxiety disorders: Secondary | ICD-10-CM

## 2019-12-28 DIAGNOSIS — B20 Human immunodeficiency virus [HIV] disease: Secondary | ICD-10-CM

## 2020-01-03 ENCOUNTER — Other Ambulatory Visit: Payer: Self-pay

## 2020-01-03 DIAGNOSIS — F418 Other specified anxiety disorders: Secondary | ICD-10-CM

## 2020-01-03 DIAGNOSIS — B20 Human immunodeficiency virus [HIV] disease: Secondary | ICD-10-CM

## 2020-01-05 NOTE — Telephone Encounter (Signed)
Please advise on refills.  

## 2020-01-06 MED ORDER — ZOLPIDEM TARTRATE 10 MG PO TABS: 10.0000 mg | ORAL_TABLET | Freq: Every evening | ORAL | 0 refills | Status: DC | PRN

## 2020-01-06 MED ORDER — CLONAZEPAM 0.5 MG PO TABS: 0.5000 mg | ORAL_TABLET | Freq: Two times a day (BID) | ORAL | 0 refills | Status: DC | PRN

## 2020-01-06 NOTE — Addendum Note (Signed)
Addended by: Jeanine Luz D on: 01/06/2020 09:24 AM   Modules accepted: Orders

## 2020-02-01 ENCOUNTER — Other Ambulatory Visit: Payer: Self-pay | Admitting: Family

## 2020-02-01 DIAGNOSIS — Z21 Asymptomatic human immunodeficiency virus [HIV] infection status: Secondary | ICD-10-CM

## 2020-02-01 DIAGNOSIS — I10 Essential (primary) hypertension: Secondary | ICD-10-CM

## 2020-02-09 ENCOUNTER — Telehealth: Payer: Self-pay

## 2020-02-09 NOTE — Telephone Encounter (Signed)
Called to get an appointment scheduled for patient, patient stated he is having mouth surgery and would call back after that

## 2020-02-13 ENCOUNTER — Other Ambulatory Visit: Payer: 59

## 2020-02-13 ENCOUNTER — Other Ambulatory Visit: Payer: Self-pay | Admitting: Emergency Medicine

## 2020-02-13 ENCOUNTER — Other Ambulatory Visit: Payer: Self-pay

## 2020-02-13 ENCOUNTER — Other Ambulatory Visit: Payer: Self-pay | Admitting: *Deleted

## 2020-02-13 DIAGNOSIS — Z21 Asymptomatic human immunodeficiency virus [HIV] infection status: Secondary | ICD-10-CM

## 2020-02-14 LAB — T-HELPER CELL (CD4) - (RCID CLINIC ONLY)
CD4 % Helper T Cell: 38 % (ref 33–65)
CD4 T Cell Abs: 485 /uL (ref 400–1790)

## 2020-02-19 LAB — CBC WITH DIFFERENTIAL/PLATELET
Absolute Monocytes: 399 cells/uL (ref 200–950)
Basophils Absolute: 21 cells/uL (ref 0–200)
Basophils Relative: 0.5 %
Eosinophils Absolute: 21 cells/uL (ref 15–500)
Eosinophils Relative: 0.5 %
HCT: 41.9 % (ref 38.5–50.0)
Hemoglobin: 15.1 g/dL (ref 13.2–17.1)
Lymphs Abs: 1352 cells/uL (ref 850–3900)
MCH: 35 pg — ABNORMAL HIGH (ref 27.0–33.0)
MCHC: 36 g/dL (ref 32.0–36.0)
MCV: 97.2 fL (ref 80.0–100.0)
MPV: 9.3 fL (ref 7.5–12.5)
Monocytes Relative: 9.5 %
Neutro Abs: 2407 cells/uL (ref 1500–7800)
Neutrophils Relative %: 57.3 %
Platelets: 259 10*3/uL (ref 140–400)
RBC: 4.31 10*6/uL (ref 4.20–5.80)
RDW: 12.1 % (ref 11.0–15.0)
Total Lymphocyte: 32.2 %
WBC: 4.2 10*3/uL (ref 3.8–10.8)

## 2020-02-19 LAB — COMPLETE METABOLIC PANEL WITH GFR
AG Ratio: 1.8 (calc) (ref 1.0–2.5)
ALT: 24 U/L (ref 9–46)
AST: 33 U/L (ref 10–35)
Albumin: 4.9 g/dL (ref 3.6–5.1)
Alkaline phosphatase (APISO): 45 U/L (ref 35–144)
BUN: 12 mg/dL (ref 7–25)
CO2: 26 mmol/L (ref 20–32)
Calcium: 10.2 mg/dL (ref 8.6–10.3)
Chloride: 99 mmol/L (ref 98–110)
Creat: 1.14 mg/dL (ref 0.70–1.33)
GFR, Est African American: 85 mL/min/{1.73_m2} (ref >=60)
GFR, Est Non African American: 74 mL/min/{1.73_m2} (ref >=60)
Globulin: 2.7 g/dL (calc) (ref 1.9–3.7)
Glucose, Bld: 127 mg/dL — ABNORMAL HIGH (ref 65–99)
Potassium: 4.4 mmol/L (ref 3.5–5.3)
Sodium: 134 mmol/L — ABNORMAL LOW (ref 135–146)
Total Bilirubin: 0.3 mg/dL (ref 0.2–1.2)
Total Protein: 7.6 g/dL (ref 6.1–8.1)

## 2020-02-19 LAB — HIV-1 RNA QUANT-NO REFLEX-BLD
HIV 1 RNA Quant: <20 Copies/mL
HIV-1 RNA Quant, Log: <1.3 Log cps/mL

## 2020-03-04 ENCOUNTER — Encounter: Payer: Self-pay | Admitting: Family

## 2020-03-04 ENCOUNTER — Telehealth (INDEPENDENT_AMBULATORY_CARE_PROVIDER_SITE_OTHER): Payer: 59 | Admitting: Family

## 2020-03-04 ENCOUNTER — Other Ambulatory Visit: Payer: Self-pay

## 2020-03-04 DIAGNOSIS — Z720 Tobacco use: Secondary | ICD-10-CM

## 2020-03-04 DIAGNOSIS — Z789 Other specified health status: Secondary | ICD-10-CM

## 2020-03-04 DIAGNOSIS — Z79899 Other long term (current) drug therapy: Secondary | ICD-10-CM

## 2020-03-04 DIAGNOSIS — Z21 Asymptomatic human immunodeficiency virus [HIV] infection status: Secondary | ICD-10-CM

## 2020-03-04 DIAGNOSIS — Z113 Encounter for screening for infections with a predominantly sexual mode of transmission: Secondary | ICD-10-CM

## 2020-03-04 DIAGNOSIS — Z0000 Encounter for general adult medical examination without abnormal findings: Secondary | ICD-10-CM | POA: Diagnosis not present

## 2020-03-04 MED ORDER — ODEFSEY 200-25-25 MG PO TABS: 1.0000 | ORAL_TABLET | Freq: Every day | ORAL | 5 refills | Status: DC

## 2020-03-04 NOTE — Assessment & Plan Note (Signed)
[name] continues to have well-controlled HIV disease with good adherence and tolerance to his ART regimen of Odefsey.  No signs/symptoms of opportunistic infection or progressive HIV disease.  We reviewed lab work and discussed plan of care.  Discussed possibility of his husband stopping prep with undetectable indicating an transmittable.  They will discuss their options.  Continue current dose of Odefsey.  Plan for follow-up in 6 months or sooner if needed with lab work 1 to 2 weeks prior to appointment.

## 2020-03-04 NOTE — Progress Notes (Signed)
Subjective:    Patient ID: Andrew Farmer, male    DOB: 1965/07/06, 54 y.o.   MRN: [id number]  No chief complaint on file.    Virtual Visit via Telephone/Video Note   I connected with Andrew Farmer on 03/04/2020 at 3:00 pm by telephone and verified that I am speaking with the correct person using two identifiers.   I discussed the limitations, risks, security and privacy concerns of performing an evaluation and management service by telephone and the availability of in person appointments. I also discussed with the patient that there may be a patient responsible charge related to this service. The patient expressed understanding and agreed to proceed.  Location:  Patient: Home Provider: Clinic   HPI:  Andrew Farmer is a 54 y.o. male with HIV disease who was last in contact with our office on 08/23/2019 with good adherence and tolerance to his ART regimen of Odefsey.  Viral load at the time was undetectable with CD4 count of 836.  Most recent blood work completed on 02/13/2020 with a viral load that remains undetectable and CD4 count of 485.  Kidney function, liver function, and electrolytes within normal ranges.  Andrew Farmer continues to take his Charlett Lango as prescribed with no adverse side effects or missed doses since his last office visit.  Overall feeling well today and is currently scheduled for dental surgery within the coming week. Denies fevers, chills, night sweats, headaches, changes in vision, neck pain/stiffness, nausea, diarrhea, vomiting, lesions or rashes.  Andrew Farmer has no problems obtaining his medication from the pharmacy and has changed insurances and is likely to change insurances beginning in January.  He is newly married to his long-term partner.  His husband is HIV negative and previously on PrEP and considering stopping PrEP.  Denies feelings of being down, depressed, or hopeless recently.  No recreational or illicit drug use.  He does continue to drink about 5 alcoholic  drinks per night and smokes approximately 1 pack of cigarettes per day.  Covid vaccines up-to-date per recommendations with boosters on 01/30/2020.  Healthcare maintenance due includes colonoscopy for colon cancer screening and Prevnar.  No Known Allergies    Outpatient Medications Prior to Visit  Medication Sig Dispense Refill  . clonazePAM (KLONOPIN) 0.5 MG tablet Take 1 tablet (0.5 mg total) by mouth 2 (two) times daily as needed for anxiety. 30 tablet 0  . olmesartan (BENICAR) 40 MG tablet TAKE 1 TABLET(40 MG) BY MOUTH DAILY 30 tablet 0  . zolpidem (AMBIEN) 10 MG tablet Take 1 tablet (10 mg total) by mouth at bedtime as needed. 30 tablet 0  . ODEFSEY 200-25-25 MG TABS tablet TAKE 1 TABLET BY MOUTH DAILY 30 tablet 0   No facility-administered medications prior to visit.     History reviewed. No pertinent past medical history.   History reviewed. No pertinent surgical history.     Review of Systems  Constitutional: Negative for appetite change, chills, fatigue, fever and unexpected weight change.  Eyes: Negative for visual disturbance.  Respiratory: Negative for cough, chest tightness, shortness of breath and wheezing.   Cardiovascular: Negative for chest pain and leg swelling.  Gastrointestinal: Negative for abdominal pain, constipation, diarrhea, nausea and vomiting.  Genitourinary: Negative for dysuria, flank pain, frequency, genital sores, hematuria and urgency.  Skin: Negative for rash.  Allergic/Immunologic: Negative for immunocompromised state.  Neurological: Negative for dizziness and headaches.      Objective:    Nursing note and vital signs reviewed.  Andrew Farmer sounds to be doing well and is speaking in full, complete sentences.  Physical exam is limited secondary to telehealth visit. Assessment & Plan:   Problem List Items Addressed This Visit      Other   Tobacco abuse (Chronic)    Andrew Farmer continues to smoke approximately 1 pack of cigarettes per day  on average.  Discussed the importance of tobacco cessation to reduce risk of cardiovascular, respiratory, malignant disease in the future.  He is in the precontemplation stage of quitting and has tried patches in the past.  Continue to monitor.      HIV (human immunodeficiency virus infection) (HCC) - Primary    Andrew Farmer continues to have well-controlled HIV disease with good adherence and tolerance to his ART regimen of Odefsey.  No signs/symptoms of opportunistic infection or progressive HIV disease.  We reviewed lab work and discussed plan of care.  Discussed possibility of his husband stopping prep with undetectable indicating an transmittable.  They will discuss their options.  Continue current dose of Odefsey.  Plan for follow-up in 6 months or sooner if needed with lab work 1 to 2 weeks prior to appointment.      Relevant Medications   emtricitabine-rilpivir-tenofovir AF (ODEFSEY) 200-25-25 MG TABS tablet   Other Relevant Orders   HIV-1 RNA quant-no reflex-bld   T-helper cell (CD4)- ([facility] clinic only)   Comprehensive metabolic panel   CBC   Healthcare maintenance     Discussed importance of safe sexual practice to reduce risk of STI.  Currently in a monogamous relationship.  Covid vaccines up-to-date per recommendation.  Recommend Prevnar vaccine when available.  Discussed/encouraged colonoscopy for colon cancer screening.  Routine dental care is up-to-date and undergoing dental procedure.      Alcohol consumption heavy    Andrew Farmer continues to have high levels of alcohol consumption drinking approximately 5 beers per day on average.  Discussed the recommendation of reducing alcohol intake to no more than 2 alcoholic beverages per night.  Continue to monitor.       Other Visit Diagnoses    Screening for STDs (sexually transmitted diseases)       Relevant Orders   RPR   Pharmacologic therapy       Relevant Orders   Lipid panel       I have changed Andrew Farmer. I am also having him maintain his zolpidem, clonazePAM, and olmesartan.   Meds ordered this encounter  Medications  . emtricitabine-rilpivir-tenofovir AF (ODEFSEY) 200-25-25 MG TABS tablet    Sig: Take 1 tablet by mouth daily.    Dispense:  30 tablet    Refill:  5    Order Specific Question:   Supervising Provider    Answer:   Judyann Munson [phone]     I discussed the assessment and treatment plan with the patient. The patient was provided an opportunity to ask questions and all were answered. The patient agreed with the plan and demonstrated an understanding of the instructions.   The patient was advised to call back or seek an in-person evaluation if the symptoms worsen or if the condition fails to improve as anticipated.   I provided  13  minutes of non-face-to-face time during this encounter.  Follow-up: Return in about 6 months (around 09/01/2020), or if symptoms worsen or fail to improve.   Marcos Eke, MSN, FNP-C Nurse Practitioner [facility] for Infectious Disease [facility] Main number: [phone]

## 2020-03-04 NOTE — Assessment & Plan Note (Signed)
Mr. Gettis continues to have high levels of alcohol consumption drinking approximately 5 beers per day on average.  Discussed the recommendation of reducing alcohol intake to no more than 2 alcoholic beverages per night.  Continue to monitor.

## 2020-03-04 NOTE — Assessment & Plan Note (Signed)
Andrew Farmer continues to smoke approximately 1 pack of cigarettes per day on average.  Discussed the importance of tobacco cessation to reduce risk of cardiovascular, respiratory, malignant disease in the future.  He is in the precontemplation stage of quitting and has tried patches in the past.  Continue to monitor.

## 2020-03-04 NOTE — Assessment & Plan Note (Signed)
   Discussed importance of safe sexual practice to reduce risk of STI.  Currently in a monogamous relationship.  Covid vaccines up-to-date per recommendation.  Recommend Prevnar vaccine when available.  Discussed/encouraged colonoscopy for colon cancer screening.  Routine dental care is up-to-date and undergoing dental procedure.

## 2020-03-04 NOTE — Patient Instructions (Signed)
Nice to speak with you.  Congratulations on your recent wedding!  Continue to take your [facility] daily as prescribed.  Refills have been sent to the pharmacy.  Please let us know about any pharmacy changes that may be necessary.  Plan for follow-up in 6 months or sooner if needed with lab work 1 to 2 weeks prior to appointment.  Have a great day and stay safe!  Happy holidays!

## 2020-05-13 DIAGNOSIS — I10 Essential (primary) hypertension: Secondary | ICD-10-CM | POA: Diagnosis not present

## 2020-05-13 DIAGNOSIS — Z0000 Encounter for general adult medical examination without abnormal findings: Secondary | ICD-10-CM | POA: Diagnosis not present

## 2020-05-13 DIAGNOSIS — Z23 Encounter for immunization: Secondary | ICD-10-CM | POA: Diagnosis not present

## 2020-06-06 DIAGNOSIS — Z1211 Encounter for screening for malignant neoplasm of colon: Secondary | ICD-10-CM | POA: Diagnosis not present

## 2020-08-02 DIAGNOSIS — D125 Benign neoplasm of sigmoid colon: Secondary | ICD-10-CM | POA: Diagnosis not present

## 2020-08-02 DIAGNOSIS — Z1211 Encounter for screening for malignant neoplasm of colon: Secondary | ICD-10-CM | POA: Diagnosis not present

## 2020-08-02 DIAGNOSIS — D124 Benign neoplasm of descending colon: Secondary | ICD-10-CM | POA: Diagnosis not present

## 2020-08-02 DIAGNOSIS — K635 Polyp of colon: Secondary | ICD-10-CM | POA: Diagnosis not present

## 2020-09-11 ENCOUNTER — Other Ambulatory Visit: Payer: Self-pay | Admitting: Family

## 2020-09-11 DIAGNOSIS — Z21 Asymptomatic human immunodeficiency virus [HIV] infection status: Secondary | ICD-10-CM

## 2020-09-20 ENCOUNTER — Other Ambulatory Visit: Payer: Self-pay

## 2020-09-20 ENCOUNTER — Encounter: Payer: Self-pay | Admitting: Family

## 2020-09-20 ENCOUNTER — Ambulatory Visit (INDEPENDENT_AMBULATORY_CARE_PROVIDER_SITE_OTHER): Payer: BC Managed Care – PPO | Admitting: Family

## 2020-09-20 VITALS — BP 142/84 | HR 106 | Temp 98.5°F | Wt 155.0 lb

## 2020-09-20 DIAGNOSIS — Z0000 Encounter for general adult medical examination without abnormal findings: Secondary | ICD-10-CM | POA: Diagnosis not present

## 2020-09-20 DIAGNOSIS — Z113 Encounter for screening for infections with a predominantly sexual mode of transmission: Secondary | ICD-10-CM

## 2020-09-20 DIAGNOSIS — Z79899 Other long term (current) drug therapy: Secondary | ICD-10-CM | POA: Diagnosis not present

## 2020-09-20 DIAGNOSIS — Z720 Tobacco use: Secondary | ICD-10-CM

## 2020-09-20 DIAGNOSIS — Z23 Encounter for immunization: Secondary | ICD-10-CM | POA: Diagnosis not present

## 2020-09-20 DIAGNOSIS — Z21 Asymptomatic human immunodeficiency virus [HIV] infection status: Secondary | ICD-10-CM | POA: Diagnosis not present

## 2020-09-20 MED ORDER — ODEFSEY 200-25-25 MG PO TABS: 1.0000 | ORAL_TABLET | Freq: Every day | ORAL | 5 refills | Status: DC

## 2020-09-20 NOTE — Assessment & Plan Note (Addendum)
Mr. Everton stopped smoking approximately 3 months ago. Congratulated on this accomplishment.

## 2020-09-20 NOTE — Patient Instructions (Signed)
Nice to see you.  Continue to take your medication daily.  We will check your blood work today and I will be available via MyChart.  Refills were sent to the pharmacy.  Plan for follow-up in 4 months or sooner if needed with lab work on the same day.  Have a great day and stay safe!

## 2020-09-20 NOTE — Assessment & Plan Note (Signed)
Andrew Farmer has well-controlled HIV disease with good adherence and tolerance to his ART regimen of Odefsey.  No signs/symptoms of opportunistic infection or progressive HIV.  Reviewed previous lab work and discussed plan of care.  Check blood work today.  Continue current dose of Odefsey.  Plan for follow-up in 4 months or sooner if needed with lab work on the same day.

## 2020-09-20 NOTE — Progress Notes (Signed)
Subjective:    Patient ID: Andrew Farmer, male    DOB: 1965-04-29, 55 y.o.   MRN: [id number]  Chief Complaint  Patient presents with   Follow-up    Follow up and labs      HPI:  Andrew Farmer is a 55 y.o. male with HIV disease last seen through telehealth visit on 03/04/2020 with well-controlled virus and good adherence and tolerance to his ART regimen of Odefsey.  Viral load at the time was undetectable with CD4 count of 485.  No recent blood work completed.  Here today for routine follow-up.  Andrew Farmer continues to take his Charlett Lango daily as prescribed with no adverse side effects or missed doses since his last office visit.  Overall feeling well today with no new concerns/complaints. Denies fevers, chills, night sweats, headaches, changes in vision, neck pain/stiffness, nausea, diarrhea, vomiting, lesions or rashes.  Andrew Farmer has no problems obtaining medication from the pharmacy and remains covered through [facility].  Denies feelings of being down, depressed, or hopeless recently.  No recreational or illicit drug use and has quit tobacco use over the last 3 months and reduce his alcohol to approximately 2 drinks per night.  Condoms offered and declined.  Has dentures with no additional need for routine dental care.  He is COVID vaccinated and boosted.  Declines STI testing.  Healthcare maintenance due includes Prevnar.  No Known Allergies    Outpatient Medications Prior to Visit  Medication Sig Dispense Refill   clonazePAM (KLONOPIN) 0.5 MG tablet Take 1 tablet (0.5 mg total) by mouth 2 (two) times daily as needed for anxiety. 30 tablet 0   olmesartan (BENICAR) 40 MG tablet TAKE 1 TABLET(40 MG) BY MOUTH DAILY 30 tablet 0   zolpidem (AMBIEN) 10 MG tablet Take 1 tablet (10 mg total) by mouth at bedtime as needed. 30 tablet 0   ODEFSEY 200-25-25 MG TABS tablet TAKE 1 TABLET BY MOUTH EVERY DAY 30 tablet 0   No facility-administered medications prior to visit.     History  reviewed. No pertinent past medical history.   History reviewed. No pertinent surgical history.     Review of Systems  Constitutional:  Negative for appetite change, chills, fatigue, fever and unexpected weight change.  Eyes:  Negative for visual disturbance.  Respiratory:  Negative for cough, chest tightness, shortness of breath and wheezing.   Cardiovascular:  Negative for chest pain and leg swelling.  Gastrointestinal:  Negative for abdominal pain, constipation, diarrhea, nausea and vomiting.  Genitourinary:  Negative for dysuria, flank pain, frequency, genital sores, hematuria and urgency.  Skin:  Negative for rash.  Allergic/Immunologic: Negative for immunocompromised state.  Neurological:  Negative for dizziness and headaches.     Objective:    BP (!) 142/84   Pulse (!) 106   Temp 98.5 F (36.9 C)   Wt 155 lb (70.3 kg)   SpO2 94%   BMI 22.89 kg/m  Nursing note and vital signs reviewed.  Physical Exam Constitutional:      General: He is not in acute distress.    Appearance: He is well-developed.  Eyes:     Conjunctiva/sclera: Conjunctivae normal.  Cardiovascular:     Rate and Rhythm: Normal rate and regular rhythm.     Heart sounds: Normal heart sounds. No murmur heard.   No friction rub. No gallop.  Pulmonary:     Effort: Pulmonary effort is normal. No respiratory distress.     Breath sounds: Normal breath sounds.  No wheezing or rales.  Chest:     Chest wall: No tenderness.  Abdominal:     General: Bowel sounds are normal.     Palpations: Abdomen is soft.     Tenderness: There is no abdominal tenderness.  Musculoskeletal:     Cervical back: Neck supple.  Lymphadenopathy:     Cervical: No cervical adenopathy.  Skin:    General: Skin is warm and dry.     Findings: No rash.  Neurological:     Mental Status: He is alert and oriented to person, place, and time.  Psychiatric:        Behavior: Behavior normal.        Thought Content: Thought content normal.         Judgment: Judgment normal.     Depression screen [facility] 2/9 09/20/2020 08/17/2018 05/05/2017 02/26/2015 04/25/2013  Decreased Interest 0 0 0 0 0  Down, Depressed, Hopeless 0 0 0 0 0  PHQ - 2 Score 0 0 0 0 0       Assessment & Plan:    Patient Active Problem List   Diagnosis Date Noted   Alcohol consumption heavy 03/04/2020   Healthcare maintenance 08/17/2018   Viral gastroenteritis 08/17/2018   Elevated liver enzymes 02/10/2018   Hepatitis B immune 05/06/2016   Tobacco abuse 06/10/2011   Essential hypertension 10/27/2010   Depression with anxiety 10/27/2010   HIV (human immunodeficiency virus infection) (HCC) 10/27/2010     Problem List Items Addressed This Visit       Other   Tobacco abuse (Chronic)    Andrew Farmer stopped smoking approximately 3 months ago. Congratulated on this accomplishment.        HIV (human immunodeficiency virus infection) (HCC) - Primary    Andrew Farmer has well-controlled HIV disease with good adherence and tolerance to his ART regimen of Odefsey.  No signs/symptoms of opportunistic infection or progressive HIV.  Reviewed previous lab work and discussed plan of care.  Check blood work today.  Continue current dose of Odefsey.  Plan for follow-up in 4 months or sooner if needed with lab work on the same day.       Relevant Medications   emtricitabine-rilpivir-tenofovir AF (ODEFSEY) 200-25-25 MG TABS tablet   Other Relevant Orders   COMPLETE METABOLIC PANEL WITH GFR   HIV-1 RNA quant-no reflex-bld   T-helper cells (CD4) count   Healthcare maintenance    Discussed importance of safe sexual practice to reduce risk of STI.  Condoms declined. Has dentures with no additional need for routine dental care. Prevnar updated today. COVID vaccinated and boosted.       Other Visit Diagnoses     Screening for STDs (sexually transmitted diseases)       Relevant Orders   RPR   Pharmacologic therapy       Relevant Orders   Lipid Profile   Need for  pneumococcal vaccination       Relevant Orders   Pneumococcal conjugate vaccine 13-valent IM (Completed)        I have changed Andrew Farmer. I am also having him maintain his zolpidem, clonazePAM, and olmesartan.   Meds ordered this encounter  Medications   emtricitabine-rilpivir-tenofovir AF (ODEFSEY) 200-25-25 MG TABS tablet    Sig: Take 1 tablet by mouth daily.    Dispense:  30 tablet    Refill:  5    Order Specific Question:   Supervising Provider    Answer:   Judyann Munson [phone]  Follow-up: Return in about 4 months (around 01/20/2021).   Marcos Eke, MSN, FNP-C Nurse Practitioner [facility] for Infectious Disease [facility] Main number: [phone]

## 2020-09-20 NOTE — Assessment & Plan Note (Signed)
   Discussed importance of safe sexual practice to reduce risk of STI.  Condoms declined.  Has dentures with no additional need for routine dental care.  Prevnar updated today.  COVID vaccinated and boosted.

## 2020-09-24 LAB — COMPLETE METABOLIC PANEL WITH GFR
AG Ratio: 1.7 (calc) (ref 1.0–2.5)
ALT: 37 U/L (ref 9–46)
AST: 50 U/L — ABNORMAL HIGH (ref 10–35)
Albumin: 5 g/dL (ref 3.6–5.1)
Alkaline phosphatase (APISO): 41 U/L (ref 35–144)
BUN: 7 mg/dL (ref 7–25)
CO2: 27 mmol/L (ref 20–32)
Calcium: 9.8 mg/dL (ref 8.6–10.3)
Chloride: 101 mmol/L (ref 98–110)
Creat: 1.01 mg/dL (ref 0.70–1.33)
GFR, Est African American: 98 mL/min/{1.73_m2} (ref >=60)
GFR, Est Non African American: 85 mL/min/{1.73_m2} (ref >=60)
Globulin: 3 g/dL (calc) (ref 1.9–3.7)
Glucose, Bld: 118 mg/dL — ABNORMAL HIGH (ref 65–99)
Potassium: 4.5 mmol/L (ref 3.5–5.3)
Sodium: 135 mmol/L (ref 135–146)
Total Bilirubin: 0.4 mg/dL (ref 0.2–1.2)
Total Protein: 8 g/dL (ref 6.1–8.1)

## 2020-09-24 LAB — HIV-1 RNA QUANT-NO REFLEX-BLD
HIV 1 RNA Quant: <20 Copies/mL — ABNORMAL HIGH
HIV-1 RNA Quant, Log: <1.3 Log cps/mL — ABNORMAL HIGH

## 2020-09-24 LAB — T-HELPER CELLS (CD4) COUNT (NOT AT ARMC)
Absolute CD4: 509 cells/uL (ref 490–1740)
CD4 T Helper %: 43 % (ref 30–61)
Total lymphocyte count: 1187 cells/uL (ref 850–3900)

## 2020-09-24 LAB — LIPID PANEL
Cholesterol: 196 mg/dL (ref <200)
HDL: 121 mg/dL (ref >=40)
LDL Cholesterol (Calc): 63 mg/dL (calc)
Non-HDL Cholesterol (Calc): 75 mg/dL (calc) (ref <130)
Total CHOL/HDL Ratio: 1.6 (calc) (ref <5.0)
Triglycerides: 42 mg/dL (ref <150)

## 2020-09-24 LAB — RPR: RPR Ser Ql: NONREACTIVE

## 2020-11-11 DIAGNOSIS — I10 Essential (primary) hypertension: Secondary | ICD-10-CM | POA: Diagnosis not present

## 2020-11-11 DIAGNOSIS — R55 Syncope and collapse: Secondary | ICD-10-CM | POA: Diagnosis not present

## 2020-11-11 DIAGNOSIS — E860 Dehydration: Secondary | ICD-10-CM | POA: Diagnosis not present

## 2020-12-03 DIAGNOSIS — R748 Abnormal levels of other serum enzymes: Secondary | ICD-10-CM | POA: Diagnosis not present

## 2020-12-03 DIAGNOSIS — I10 Essential (primary) hypertension: Secondary | ICD-10-CM | POA: Diagnosis not present

## 2020-12-03 DIAGNOSIS — E860 Dehydration: Secondary | ICD-10-CM | POA: Diagnosis not present

## 2020-12-03 DIAGNOSIS — R000 Tachycardia, unspecified: Secondary | ICD-10-CM | POA: Diagnosis not present

## 2021-01-16 ENCOUNTER — Encounter: Payer: Self-pay | Admitting: Family

## 2021-01-16 ENCOUNTER — Ambulatory Visit (INDEPENDENT_AMBULATORY_CARE_PROVIDER_SITE_OTHER): Payer: BC Managed Care – PPO | Admitting: Family

## 2021-01-16 ENCOUNTER — Other Ambulatory Visit: Payer: Self-pay

## 2021-01-16 VITALS — BP 155/92 | HR 79 | Temp 98.2°F | Wt 155.0 lb

## 2021-01-16 DIAGNOSIS — Z0000 Encounter for general adult medical examination without abnormal findings: Secondary | ICD-10-CM | POA: Diagnosis not present

## 2021-01-16 DIAGNOSIS — Z21 Asymptomatic human immunodeficiency virus [HIV] infection status: Secondary | ICD-10-CM

## 2021-01-16 DIAGNOSIS — Z79899 Other long term (current) drug therapy: Secondary | ICD-10-CM | POA: Diagnosis not present

## 2021-01-16 DIAGNOSIS — Z720 Tobacco use: Secondary | ICD-10-CM | POA: Diagnosis not present

## 2021-01-16 MED ORDER — ODEFSEY 200-25-25 MG PO TABS: 1.0000 | ORAL_TABLET | Freq: Every day | ORAL | 5 refills | Status: DC

## 2021-01-16 NOTE — Patient Instructions (Addendum)
Nice to see you.   We will check your lab work today.  Continue to take your medication daily.   Refills have been sent to the pharmacy.   Plan for follow up in 6 months or sooner if needed with lab work on the same day.  Have a great day and stay safe! 

## 2021-01-16 NOTE — Progress Notes (Signed)
Brief Narrative   Patient ID: Andrew Farmer, male    DOB: 12-Oct-1965, 55 y.o.   MRN: [id number]  Mr. Hingle is a 55 y/o caucasian male with HIV disease diagnosed in February 2012. Risk factor for HIV is MSM. Initial CD4 count of 250 and vrial load of 43,000. Genotype without any significant drug resistance patterns. No history of opportunistic infection. [id number] negative. Previous ART regimen of Atripla, Complara, and now [place].   Subjective:    Chief Complaint  Patient presents with   Follow-up    HPI:  Andrew Farmer is a 55 y.o. male with well-controlled virus and good adherence and tolerance to his ART regimen of Odefsey.  Viral load at the time was undetectable with CD4 count of 509.  Here today for routine follow-up.  Mr. Goza continues to take his Charlett Lango daily as prescribed with no adverse side effects.  Overall feeling well today with no new concerns/complaints. Denies fevers, chills, night sweats, headaches, changes in vision, neck pain/stiffness, nausea, diarrhea, vomiting, lesions or rashes.  Mr. Tribby has no problems obtaining medication from pharmacy and remains covered through [facility].  Denies feelings of being down, depressed, or hopeless recently.  Alcohol use is on occasion with no current recreational or illicit drug use and has stopped smoking and continuing to use nicotine patches.  Condoms offered and declined.  Flu shot up-to-date per recommendations.  Routine dental care not necessary as he has 4 sets of dentures now.  No Known Allergies    Outpatient Medications Prior to Visit  Medication Sig Dispense Refill   clonazePAM (KLONOPIN) 0.5 MG tablet Take 1 tablet (0.5 mg total) by mouth 2 (two) times daily as needed for anxiety. 30 tablet 0   olmesartan (BENICAR) 40 MG tablet TAKE 1 TABLET(40 MG) BY MOUTH DAILY 30 tablet 0   zolpidem (AMBIEN) 10 MG tablet Take 1 tablet (10 mg total) by mouth at bedtime as needed. 30 tablet 0    emtricitabine-rilpivir-tenofovir AF (ODEFSEY) 200-25-25 MG TABS tablet Take 1 tablet by mouth daily. 30 tablet 5   No facility-administered medications prior to visit.     Past Medical History:  Diagnosis Date   Viral gastroenteritis 08/17/2018     History reviewed. No pertinent surgical history.    Review of Systems  Constitutional:  Negative for appetite change, chills, fatigue, fever and unexpected weight change.  Eyes:  Negative for visual disturbance.  Respiratory:  Negative for cough, chest tightness, shortness of breath and wheezing.   Cardiovascular:  Negative for chest pain and leg swelling.  Gastrointestinal:  Negative for abdominal pain, constipation, diarrhea, nausea and vomiting.  Genitourinary:  Negative for dysuria, flank pain, frequency, genital sores, hematuria and urgency.  Skin:  Negative for rash.  Allergic/Immunologic: Negative for immunocompromised state.  Neurological:  Negative for dizziness and headaches.     Objective:    BP (!) 155/92   Pulse 79   Temp 98.2 F (36.8 C) (Temporal)   Wt 155 lb (70.3 kg)   SpO2 100%   BMI 22.89 kg/m  Nursing note and vital signs reviewed.  Physical Exam Constitutional:      General: He is not in acute distress.    Appearance: He is well-developed.  Eyes:     Conjunctiva/sclera: Conjunctivae normal.  Cardiovascular:     Rate and Rhythm: Normal rate and regular rhythm.     Heart sounds: Normal heart sounds. No murmur heard.   No friction rub. No gallop.  Pulmonary:     Effort: Pulmonary effort is normal. No respiratory distress.     Breath sounds: Normal breath sounds. No wheezing or rales.  Chest:     Chest wall: No tenderness.  Abdominal:     General: Bowel sounds are normal.     Palpations: Abdomen is soft.     Tenderness: There is no abdominal tenderness.  Musculoskeletal:     Cervical back: Neck supple.  Lymphadenopathy:     Cervical: No cervical adenopathy.  Skin:    General: Skin is warm and  dry.     Findings: No rash.  Neurological:     Mental Status: He is alert and oriented to person, place, and time.  Psychiatric:        Behavior: Behavior normal.        Thought Content: Thought content normal.        Judgment: Judgment normal.     Depression screen [facility] 2/9 09/20/2020 08/17/2018 05/05/2017 02/26/2015 04/25/2013  Decreased Interest 0 0 0 0 0  Down, Depressed, Hopeless 0 0 0 0 0  PHQ - 2 Score 0 0 0 0 0       Assessment & Plan:    Patient Active Problem List   Diagnosis Date Noted   Alcohol consumption heavy 03/04/2020   Healthcare maintenance 08/17/2018   Elevated liver enzymes 02/10/2018   Hepatitis B immune 05/06/2016   Tobacco abuse 06/10/2011   Essential hypertension 10/27/2010   Depression with anxiety 10/27/2010   HIV (human immunodeficiency virus infection) (HCC) 10/27/2010     Problem List Items Addressed This Visit       Other   Tobacco abuse (Chronic)    Mr. Kean has quit smoking and is currently using nicotine patches.  Overall doing well.  Congratulated on this effort.      HIV (human immunodeficiency virus infection) [facility])    Mr. Widrig continues to have well-controlled virus with good adherence and tolerance to his ART regimen of Odefsey.  No signs/symptoms of opportunistic infection.  We reviewed lab work and discussed plan of care.  Continue current dose of Odefsey.  Plan for follow-up in 6 months or sooner if needed with lab work 1 to 2 weeks prior to appointment.      Relevant Medications   emtricitabine-rilpivir-tenofovir AF (ODEFSEY) 200-25-25 MG TABS tablet   Other Relevant Orders   HIV-1 RNA quant-no reflex-bld   T-helper cell (CD4)- ([facility] clinic only)   COMPLETE METABOLIC PANEL WITH GFR   Healthcare maintenance    Discussed importance of safe sexual practice and condom usage.  Condoms declined. Routine vaccinations up-to-date per recommendations. Has dentures with no further need for routine dental care.      Other Visit  Diagnoses     Pharmacologic therapy    -  Primary        I am having Courtney Heys maintain his zolpidem, clonazePAM, olmesartan, and Odefsey.   Meds ordered this encounter  Medications   emtricitabine-rilpivir-tenofovir AF (ODEFSEY) 200-25-25 MG TABS tablet    Sig: Take 1 tablet by mouth daily.    Dispense:  30 tablet    Refill:  5    Order Specific Question:   Supervising Provider    Answer:   Judyann Munson [[id number]]     Follow-up: Return in about 6 months (around 07/17/2021), or if symptoms worsen or fail to improve.   Marcos Eke, MSN, FNP-C Nurse Practitioner [facility] for [facility] Main  number: [phone]

## 2021-01-16 NOTE — Assessment & Plan Note (Signed)
Andrew Farmer has quit smoking and is currently using nicotine patches.  Overall doing well.  Congratulated on this effort.

## 2021-01-16 NOTE — Assessment & Plan Note (Signed)
Andrew Farmer continues to have well-controlled virus with good adherence and tolerance to his ART regimen of Odefsey.  No signs/symptoms of opportunistic infection.  We reviewed lab work and discussed plan of care.  Continue current dose of Odefsey.  Plan for follow-up in 6 months or sooner if needed with lab work 1 to 2 weeks prior to appointment.

## 2021-01-16 NOTE — Assessment & Plan Note (Signed)
   Discussed importance of safe sexual practice and condom usage.  Condoms declined.  Routine vaccinations up-to-date per recommendations.  Has dentures with no further need for routine dental care.

## 2021-01-17 LAB — T-HELPER CELL (CD4) - (RCID CLINIC ONLY)
CD4 % Helper T Cell: 50 % (ref 33–65)
CD4 T Cell Abs: 665 /uL (ref 400–1790)

## 2021-01-18 LAB — HIV-1 RNA QUANT-NO REFLEX-BLD
HIV 1 RNA Quant: NOT DETECTED Copies/mL
HIV-1 RNA Quant, Log: NOT DETECTED Log cps/mL

## 2021-01-18 LAB — COMPLETE METABOLIC PANEL WITH GFR
AG Ratio: 1.9 (calc) (ref 1.0–2.5)
ALT: 58 U/L — ABNORMAL HIGH (ref 9–46)
AST: 67 U/L — ABNORMAL HIGH (ref 10–35)
Albumin: 5 g/dL (ref 3.6–5.1)
Alkaline phosphatase (APISO): 45 U/L (ref 35–144)
BUN: 13 mg/dL (ref 7–25)
CO2: 28 mmol/L (ref 20–32)
Calcium: 10 mg/dL (ref 8.6–10.3)
Chloride: 99 mmol/L (ref 98–110)
Creat: 1.04 mg/dL (ref 0.70–1.30)
Globulin: 2.6 g/dL (calc) (ref 1.9–3.7)
Glucose, Bld: 91 mg/dL (ref 65–99)
Potassium: 5.1 mmol/L (ref 3.5–5.3)
Sodium: 135 mmol/L (ref 135–146)
Total Bilirubin: 0.7 mg/dL (ref 0.2–1.2)
Total Protein: 7.6 g/dL (ref 6.1–8.1)
eGFR: 86 mL/min/{1.73_m2} (ref >=60)

## 2021-09-07 ENCOUNTER — Encounter: Payer: Self-pay | Admitting: Infectious Diseases

## 2021-11-06 ENCOUNTER — Other Ambulatory Visit: Payer: Self-pay

## 2021-11-06 DIAGNOSIS — Z21 Asymptomatic human immunodeficiency virus [HIV] infection status: Secondary | ICD-10-CM

## 2021-11-06 MED ORDER — ODEFSEY 200-25-25 MG PO TABS: 1.0000 | ORAL_TABLET | Freq: Every day | ORAL | 0 refills | Status: DC

## 2021-11-07 ENCOUNTER — Other Ambulatory Visit: Payer: BC Managed Care – PPO

## 2021-11-07 ENCOUNTER — Other Ambulatory Visit: Payer: Self-pay

## 2021-11-07 ENCOUNTER — Other Ambulatory Visit (HOSPITAL_COMMUNITY)
Admission: RE | Admit: 2021-11-07 | Discharge: 2021-11-07 | Disposition: A | Discharge status: Home / Self Care | Payer: BC Managed Care – PPO | Source: Ambulatory Visit | Attending: Infectious Disease | Admitting: Infectious Disease

## 2021-11-07 DIAGNOSIS — Z113 Encounter for screening for infections with a predominantly sexual mode of transmission: Secondary | ICD-10-CM | POA: Insufficient documentation

## 2021-11-07 DIAGNOSIS — Z79899 Other long term (current) drug therapy: Secondary | ICD-10-CM

## 2021-11-07 DIAGNOSIS — Z21 Asymptomatic human immunodeficiency virus [HIV] infection status: Secondary | ICD-10-CM

## 2021-11-10 LAB — CBC WITH DIFFERENTIAL/PLATELET
Absolute Monocytes: 221 cells/uL (ref 200–950)
Basophils Absolute: 39 cells/uL (ref 0–200)
Basophils Relative: 1.1 %
Eosinophils Absolute: 0 cells/uL — ABNORMAL LOW (ref 15–500)
Eosinophils Relative: 0 %
HCT: 40.1 % (ref 38.5–50.0)
Hemoglobin: 14.5 g/dL (ref 13.2–17.1)
Lymphs Abs: 1082 cells/uL (ref 850–3900)
MCH: 35.3 pg — ABNORMAL HIGH (ref 27.0–33.0)
MCHC: 36.2 g/dL — ABNORMAL HIGH (ref 32.0–36.0)
MCV: 97.6 fL (ref 80.0–100.0)
MPV: 10.8 fL (ref 7.5–12.5)
Monocytes Relative: 6.3 %
Neutro Abs: 2160 cells/uL (ref 1500–7800)
Neutrophils Relative %: 61.7 %
Platelets: 109 10*3/uL — ABNORMAL LOW (ref 140–400)
RBC: 4.11 10*6/uL — ABNORMAL LOW (ref 4.20–5.80)
RDW: 11.6 % (ref 11.0–15.0)
Total Lymphocyte: 30.9 %
WBC: 3.5 10*3/uL — ABNORMAL LOW (ref 3.8–10.8)

## 2021-11-10 LAB — LIPID PANEL
Cholesterol: 213 mg/dL — ABNORMAL HIGH (ref <200)
HDL: 122 mg/dL (ref >=40)
LDL Cholesterol (Calc): 77 mg/dL (calc)
Non-HDL Cholesterol (Calc): 91 mg/dL (calc) (ref <130)
Total CHOL/HDL Ratio: 1.7 (calc) (ref <5.0)
Triglycerides: 48 mg/dL (ref <150)

## 2021-11-10 LAB — COMPLETE METABOLIC PANEL WITH GFR
AG Ratio: 1.8 (calc) (ref 1.0–2.5)
ALT: 200 U/L — ABNORMAL HIGH (ref 9–46)
AST: 257 U/L — ABNORMAL HIGH (ref 10–35)
Albumin: 5.4 g/dL — ABNORMAL HIGH (ref 3.6–5.1)
Alkaline phosphatase (APISO): 56 U/L (ref 35–144)
BUN/Creatinine Ratio: 6 (calc) (ref 6–22)
BUN: 6 mg/dL — ABNORMAL LOW (ref 7–25)
CO2: 28 mmol/L (ref 20–32)
Calcium: 10.2 mg/dL (ref 8.6–10.3)
Chloride: 95 mmol/L — ABNORMAL LOW (ref 98–110)
Creat: 1 mg/dL (ref 0.70–1.30)
Globulin: 3 g/dL (calc) (ref 1.9–3.7)
Glucose, Bld: 106 mg/dL — ABNORMAL HIGH (ref 65–99)
Potassium: 4.1 mmol/L (ref 3.5–5.3)
Sodium: 132 mmol/L — ABNORMAL LOW (ref 135–146)
Total Bilirubin: 0.7 mg/dL (ref 0.2–1.2)
Total Protein: 8.4 g/dL — ABNORMAL HIGH (ref 6.1–8.1)
eGFR: 89 mL/min/{1.73_m2} (ref >=60)

## 2021-11-10 LAB — HIV-1 RNA QUANT-NO REFLEX-BLD
HIV 1 RNA Quant: NOT DETECTED Copies/mL
HIV-1 RNA Quant, Log: NOT DETECTED Log cps/mL

## 2021-11-10 LAB — T-HELPER CELLS (CD4) COUNT (NOT AT ARMC)
Absolute CD4: 579 cells/uL (ref 490–1740)
CD4 T Helper %: 51 % (ref 30–61)
Total lymphocyte count: 1131 cells/uL (ref 850–3900)

## 2021-11-10 LAB — RPR: RPR Ser Ql: NONREACTIVE

## 2021-11-10 LAB — URINE CYTOLOGY ANCILLARY ONLY
Chlamydia: NEGATIVE
Comment: NEGATIVE
Comment: NORMAL
Neisseria Gonorrhea: NEGATIVE

## 2021-11-20 ENCOUNTER — Ambulatory Visit: Payer: BC Managed Care – PPO | Admitting: Family

## 2021-11-25 ENCOUNTER — Ambulatory Visit: Payer: BC Managed Care – PPO | Admitting: Family

## 2021-12-11 ENCOUNTER — Ambulatory Visit (INDEPENDENT_AMBULATORY_CARE_PROVIDER_SITE_OTHER): Payer: BC Managed Care – PPO | Admitting: Family

## 2021-12-11 ENCOUNTER — Other Ambulatory Visit: Payer: Self-pay

## 2021-12-11 DIAGNOSIS — F418 Other specified anxiety disorders: Secondary | ICD-10-CM

## 2021-12-11 DIAGNOSIS — R7989 Other specified abnormal findings of blood chemistry: Secondary | ICD-10-CM | POA: Diagnosis not present

## 2021-12-11 DIAGNOSIS — Z21 Asymptomatic human immunodeficiency virus [HIV] infection status: Secondary | ICD-10-CM | POA: Diagnosis not present

## 2021-12-11 DIAGNOSIS — Z0000 Encounter for general adult medical examination without abnormal findings: Secondary | ICD-10-CM

## 2021-12-11 MED ORDER — ODEFSEY 200-25-25 MG PO TABS: 1.0000 | ORAL_TABLET | Freq: Every day | ORAL | 5 refills | Status: DC

## 2021-12-11 NOTE — Patient Instructions (Signed)
Nice to see you.  We will check your lab work today.  Continue to take your medication daily as prescribed.  Refills have been sent to the pharmacy.  Plan for follow up in 1 months or sooner if needed with lab work 1-2 weeks prior to appointment.   Plan for follow up in 1 months or sooner if needed with lab work on the same day.  Have a great day and stay safe!

## 2021-12-11 NOTE — Progress Notes (Signed)
Brief Narrative   Patient ID: Andrew Farmer, male    DOB: [birth year], 56 y.o.   MRN: [id number]  Andrew Farmer is a 56 y/o caucasian male with HIV disease diagnosed in February 2012. Risk factor for HIV is MSM. Initial CD4 count of 250 and vrial load of 43,000. Genotype without any significant drug resistance patterns. No history of opportunistic infection. [id number] negative. Previous ART regimen of Atripla, Complara, and now [place].   Subjective:    Chief Complaint  Patient presents with   Follow-up    HPI:  Andrew Farmer is a 56 y.o. male with HIV disease last seen 01/16/21 with well controlled virus and good adherence and tolerance to [facility]. Viral load was undetectable and CD4 count 665. Kidney function, liver function and electrolytes within normal ranges. Most recent lab work completed on 11/07/21 with undetectable viral load and CD4 count 579. Kidney function and eletrolytes within normal ranges. Liver function tests elevated with AST 257 and ALT 200. Telehealth visit for routine follow up.   Andrew Farmer has been doing okay but struggling with a tough divorce where he went through a dark time and drank excessive amounts of alcohol. He is through that time and continues to recover slowly. Taking medication as prescribed with no adverse side effects. Eating better and alcohol consumption has been drastically cut down. Denies fevers, chills, night sweats, headaches, changes in vision, neck pain/stiffness, nausea, diarrhea, vomiting, lesions or rashes. No problems obtaining medication from the pharmacy.   No Known Allergies    Outpatient Medications Prior to Visit  Medication Sig Dispense Refill   olmesartan (BENICAR) 40 MG tablet TAKE 1 TABLET(40 MG) BY MOUTH DAILY 30 tablet 0   emtricitabine-rilpivir-tenofovir AF (ODEFSEY) 200-25-25 MG TABS tablet Take 1 tablet by mouth daily. 30 tablet 0   clonazePAM (KLONOPIN) 0.5 MG tablet Take 1 tablet (0.5 mg total) by mouth 2 (two) times daily as  needed for anxiety. (Patient not taking: Reported on 12/11/2021) 30 tablet 0   zolpidem (AMBIEN) 10 MG tablet Take 1 tablet (10 mg total) by mouth at bedtime as needed. (Patient not taking: Reported on 12/11/2021) 30 tablet 0   No facility-administered medications prior to visit.     Past Medical History:  Diagnosis Date   Viral gastroenteritis 08/17/2018     No past surgical history on file.    Review of Systems  Constitutional:  Negative for appetite change, chills, fatigue, fever and unexpected weight change.  Eyes:  Negative for visual disturbance.  Respiratory:  Negative for cough, chest tightness, shortness of breath and wheezing.   Cardiovascular:  Negative for chest pain and leg swelling.  Gastrointestinal:  Negative for abdominal pain, constipation, diarrhea, nausea and vomiting.  Genitourinary:  Negative for dysuria, flank pain, frequency, genital sores, hematuria and urgency.  Skin:  Negative for rash.  Allergic/Immunologic: Negative for immunocompromised state.  Neurological:  Negative for dizziness and headaches.      Objective:    There were no vitals taken for this visit. Nursing note and vital signs reviewed.  Physical Exam  Physical exam limited secondary to telehealth visit. Andrew Farmer is pleasant to speak with and sounds to be in now apparent distress.      12/11/2021    3:26 PM 09/20/2020   10:07 AM 08/17/2018    2:31 PM 05/05/2017    3:02 PM 02/26/2015    2:09 PM  Depression screen PHQ 2/9  Decreased Interest 0 0 0 0 0  Down,  Depressed, Hopeless 0 0 0 0 0  PHQ - 2 Score 0 0 0 0 0       Assessment & Plan:    Patient Active Problem List   Diagnosis Date Noted   Elevated LFTs 12/12/2021   Alcohol consumption heavy 03/04/2020   Healthcare maintenance 08/17/2018   Elevated liver enzymes 02/10/2018   Hepatitis B immune 05/06/2016   Tobacco abuse 06/10/2011   Essential hypertension 10/27/2010   Depression with anxiety 10/27/2010   HIV (human  immunodeficiency virus infection) ([place]) 10/27/2010     Problem List Items Addressed This Visit       Other   Depression with anxiety    Andrew Farmer has exacerbation of his depression and anxiety likely reactive to his pending divorce. Has good support and declines counseling.       HIV (human immunodeficiency virus infection) ([place]) - Primary    [name] continues to have well controlled virus with good adherence and tolerance to [facility]. Reviewed lab work and discussed plan of care. Continue current dose of Odefsey. Plan for follow up in 6 months or sooner if needed with lab work 1-2 weeks prior to appointment.       Relevant Medications   emtricitabine-rilpivir-tenofovir AF (ODEFSEY) 200-25-25 MG TABS tablet   Healthcare maintenance    Discussed importance of safe sexual practice and condom use. Due for colon cancer screening and will discuss with Internal Medicine.  Encouraged to complete routine dental care independently and can refer to [place] clinic if needed.       Elevated LFTs    Andrew Farmer has elevated liver enzymes suspect related to heavy intake of alcohol secondary to his recent pending divorce. Recheck LFT's in 2 weeks to ensure there is decrease/resolution. May need liver ultrasound if LFTs remain elevated.       Relevant Orders   Comp Met (CMET)     I have discontinued Serafino E. Terhaar's zolpidem and clonazePAM. I am also having him maintain his olmesartan and Odefsey.   Meds ordered this encounter  Medications   emtricitabine-rilpivir-tenofovir AF (ODEFSEY) 200-25-25 MG TABS tablet    Sig: Take 1 tablet by mouth daily.    Dispense:  30 tablet    Refill:  5    Order Specific Question:   Supervising Provider    Answer:   Carlyle Basques [phone]    Virtual Visit via Telephone/Video Note   I connected with Andrew Farmer on 9/723 at 15.45 by telephone and verified that I am speaking with the correct person using two identifiers.   I discussed the  limitations, risks, security and privacy concerns of performing an evaluation and management service by telephone and the availability of in person appointments. I also discussed with the patient that there may be a patient responsible charge related to this service. The patient expressed understanding and agreed to proceed.  Location:  Patient: Home Provider: [facility]  I discussed the assessment and treatment plan with the patient. The patient was provided an opportunity to ask questions and all were answered. The patient agreed with the plan and demonstrated an understanding of the instructions.   The patient was advised to call back or seek an in-person evaluation if the symptoms worsen or if the condition fails to improve as anticipated.   I provided 12 minutes of non-face-to-face time during this encounter.   Follow-up: 2 weeks for recheck of LFTs and 6 months for HIV.    Terri Piedra, MSN, FNP-C  Nurse Practitioner [facility] for Infect[facility] [place] number: [phone]

## 2021-12-12 ENCOUNTER — Encounter: Payer: Self-pay | Admitting: Family

## 2021-12-12 DIAGNOSIS — R7989 Other specified abnormal findings of blood chemistry: Secondary | ICD-10-CM | POA: Insufficient documentation

## 2021-12-12 NOTE — Assessment & Plan Note (Signed)
Mr. Granda continues to have well controlled virus with good adherence and tolerance to [facility]. Reviewed lab work and discussed plan of care. Continue current dose of Odefsey. Plan for follow up in 6 months or sooner if needed with lab work 1-2 weeks prior to appointment.

## 2021-12-12 NOTE — Assessment & Plan Note (Signed)
[name] has exacerbation of his depression and anxiety likely reactive to his pending divorce. Has good support and declines counseling.

## 2021-12-12 NOTE — Assessment & Plan Note (Signed)
Mr. Gainor has elevated liver enzymes suspect related to heavy intake of alcohol secondary to his recent pending divorce. Recheck LFT's in 2 weeks to ensure there is decrease/resolution. May need liver ultrasound if LFTs remain elevated.

## 2021-12-12 NOTE — Assessment & Plan Note (Signed)
   Discussed importance of safe sexual practice and condom use.  Due for colon cancer screening and will discuss with Internal Medicine.   Encouraged to complete routine dental care independently and can refer to [facility] if needed.

## 2022-05-20 ENCOUNTER — Other Ambulatory Visit: Payer: Self-pay

## 2022-05-20 DIAGNOSIS — Z21 Asymptomatic human immunodeficiency virus [HIV] infection status: Secondary | ICD-10-CM

## 2022-05-21 MED ORDER — ODEFSEY 200-25-25 MG PO TABS: 1.0000 | ORAL_TABLET | Freq: Every day | ORAL | 5 refills | Status: DC

## 2022-10-21 ENCOUNTER — Other Ambulatory Visit: Payer: Self-pay | Admitting: Infectious Diseases

## 2022-10-21 DIAGNOSIS — Z21 Asymptomatic human immunodeficiency virus [HIV] infection status: Secondary | ICD-10-CM

## 2022-11-11 ENCOUNTER — Encounter: Payer: BC Managed Care – PPO | Admitting: Infectious Diseases

## 2022-12-02 ENCOUNTER — Other Ambulatory Visit: Payer: Self-pay | Admitting: *Deleted

## 2022-12-02 ENCOUNTER — Other Ambulatory Visit (HOSPITAL_COMMUNITY)
Admission: RE | Admit: 2022-12-02 | Discharge: 2022-12-02 | Disposition: A | Discharge status: Home / Self Care | Payer: Self-pay | Source: Ambulatory Visit | Attending: Infectious Diseases | Admitting: Infectious Diseases

## 2022-12-02 ENCOUNTER — Ambulatory Visit (INDEPENDENT_AMBULATORY_CARE_PROVIDER_SITE_OTHER): Payer: Self-pay | Admitting: Infectious Diseases

## 2022-12-02 ENCOUNTER — Encounter: Payer: Self-pay | Admitting: Infectious Diseases

## 2022-12-02 VITALS — BP 103/58 | HR 83 | Ht 68.0 in | Wt 131.7 lb

## 2022-12-02 DIAGNOSIS — Z113 Encounter for screening for infections with a predominantly sexual mode of transmission: Secondary | ICD-10-CM

## 2022-12-02 DIAGNOSIS — B20 Human immunodeficiency virus [HIV] disease: Secondary | ICD-10-CM

## 2022-12-02 DIAGNOSIS — Z720 Tobacco use: Secondary | ICD-10-CM

## 2022-12-02 DIAGNOSIS — Z21 Asymptomatic human immunodeficiency virus [HIV] infection status: Secondary | ICD-10-CM

## 2022-12-02 DIAGNOSIS — Z789 Other specified health status: Secondary | ICD-10-CM

## 2022-12-02 DIAGNOSIS — I10 Essential (primary) hypertension: Secondary | ICD-10-CM

## 2022-12-02 DIAGNOSIS — F17210 Nicotine dependence, cigarettes, uncomplicated: Secondary | ICD-10-CM

## 2022-12-02 DIAGNOSIS — Z79899 Other long term (current) drug therapy: Secondary | ICD-10-CM

## 2022-12-02 DIAGNOSIS — Z23 Encounter for immunization: Secondary | ICD-10-CM

## 2022-12-02 MED ORDER — ODEFSEY 200-25-25 MG PO TABS
1.0000 | ORAL_TABLET | Freq: Every day | ORAL | 3 refills | Status: DC
Start: 2022-12-02 — End: 2023-06-11

## 2022-12-02 MED ORDER — OLMESARTAN MEDOXOMIL 40 MG PO TABS: ORAL_TABLET | ORAL | 3 refills | Status: AC

## 2022-12-02 NOTE — Assessment & Plan Note (Signed)
Continues on his ARB Refilled.

## 2022-12-02 NOTE — Addendum Note (Signed)
Addended by: Maura Crandall on: 12/02/2022 05:14 PM   Modules accepted: Orders

## 2022-12-02 NOTE — Assessment & Plan Note (Signed)
He is doing well His ART is refilled His adap has been re-approved.  Offer PCV 20 Check labs today (he has mychart) Rtc in 9 months.

## 2022-12-02 NOTE — Progress Notes (Signed)
   Subjective:    Patient ID: Andrew Farmer, male  DOB: October 07, 1965, 57 y.o.        MRN: [id number]   HPI  57 yo M HIV+ (February 2012). MSM. Initial CD4 count of 250 and viral load of 43,000. Genotype naive.  Previous ART regimen of Atripla, Complara, and now Charlett Lango  He is currently divorcing from his partner, and has lost wt. Has had to work more to make up difference.  Has not had labs in some time.  Has started smoking again, has been drinking more (but now cutting back).  Interested in patch.   HIV 1 RNA Quant (Copies/mL)  Date Value  11/07/2021 Not Detected  01/16/2021 Not Detected  09/20/2020 <20 (H)   CD4 T Cell Abs (/uL)  Date Value  01/16/2021 665  02/13/2020 485  08/09/2019 836     Health Maintenance  Topic Date Due  . Zoster Vaccines- Shingrix (1 of 2) Never done  . Colonoscopy  Never done  . Lung Cancer Screening  Never done  . COVID-19 Vaccine (2 - Pfizer risk series) 02/20/2020  . DTaP/Tdap/Td (4 - Td or Tdap) 01/04/2022  . INFLUENZA VACCINE  11/05/2022  . Hepatitis C Screening  Completed  . HIV Screening  Completed  . HPV VACCINES  Aged Out      Review of Systems  Constitutional:  Positive for weight loss (24#, less regular meals). Negative for chills and fever.  Respiratory:  Negative for cough and shortness of breath.   Gastrointestinal:  Negative for constipation and diarrhea.  Genitourinary:  Negative for dysuria.  Psychiatric/Behavioral:  Positive for depression (improving). The patient does not have insomnia.    Please see HPI. All other systems reviewed and negative.     Objective:  Physical Exam Vitals reviewed.  Constitutional:      General: He is not in acute distress.    Appearance: Normal appearance. He is not ill-appearing.  HENT:     Mouth/Throat:     Mouth: Mucous membranes are moist.     Pharynx: No oropharyngeal exudate.  Eyes:     Extraocular Movements: Extraocular movements intact.     Pupils: Pupils are equal, round,  and reactive to light.  Cardiovascular:     Rate and Rhythm: Normal rate and regular rhythm.  Pulmonary:     Effort: Pulmonary effort is normal.     Breath sounds: Normal breath sounds.  Abdominal:     General: Bowel sounds are normal. There is no distension.     Palpations: Abdomen is soft.     Tenderness: There is no abdominal tenderness.  Musculoskeletal:        General: Normal range of motion.     Right lower leg: No edema.     Left lower leg: No edema.  Neurological:     General: No focal deficit present.     Mental Status: He is alert.  Psychiatric:        Mood and Affect: Mood normal.          Assessment & Plan:

## 2022-12-02 NOTE — Addendum Note (Signed)
Addended by: Bufford Spikes on: 12/02/2022 04:10 PM   Modules accepted: Orders

## 2022-12-02 NOTE — Assessment & Plan Note (Signed)
Has been cutting back Suspect his elevated LFTs are a function of this.

## 2022-12-02 NOTE — Assessment & Plan Note (Signed)
Will give him the quit line # so he can get free patches.

## 2022-12-03 LAB — T-HELPER CELLS (CD4) COUNT (NOT AT ARMC)
CD4 % Helper T Cell: 55 % (ref 33–65)
CD4 T Cell Abs: 1007 /uL (ref 400–1790)

## 2022-12-04 LAB — COMPREHENSIVE METABOLIC PANEL
ALT: 54 IU/L — ABNORMAL HIGH (ref 0–44)
AST: 63 IU/L — ABNORMAL HIGH (ref 0–40)
Albumin: 4.7 g/dL (ref 3.8–4.9)
Alkaline Phosphatase: 51 IU/L (ref 44–121)
BUN/Creatinine Ratio: 21 — ABNORMAL HIGH (ref 9–20)
BUN: 29 mg/dL — ABNORMAL HIGH (ref 6–24)
Bilirubin Total: 1.6 mg/dL — ABNORMAL HIGH (ref 0.0–1.2)
CO2: 20 mmol/L (ref 20–29)
Calcium: 10 mg/dL (ref 8.7–10.2)
Chloride: 91 mmol/L — ABNORMAL LOW (ref 96–106)
Creatinine, Ser: 1.37 mg/dL — ABNORMAL HIGH (ref 0.76–1.27)
Globulin, Total: 2.8 g/dL (ref 1.5–4.5)
Glucose: 89 mg/dL (ref 70–99)
Potassium: 4.7 mmol/L (ref 3.5–5.2)
Sodium: 129 mmol/L — ABNORMAL LOW (ref 134–144)
Total Protein: 7.5 g/dL (ref 6.0–8.5)
eGFR: 61 mL/min/{1.73_m2} (ref >59)

## 2022-12-04 LAB — CBC
Hematocrit: 33.4 % — ABNORMAL LOW (ref 37.5–51.0)
Hemoglobin: 12.1 g/dL — ABNORMAL LOW (ref 13.0–17.7)
MCH: 34.4 pg — ABNORMAL HIGH (ref 26.6–33.0)
MCHC: 36.2 g/dL — ABNORMAL HIGH (ref 31.5–35.7)
MCV: 95 fL (ref 79–97)
Platelets: 211 10*3/uL (ref 150–450)
RBC: 3.52 x10E6/uL — ABNORMAL LOW (ref 4.14–5.80)
RDW: 11.8 % (ref 11.6–15.4)
WBC: 5.3 10*3/uL (ref 3.4–10.8)

## 2022-12-04 LAB — RPR: RPR Ser Ql: NONREACTIVE

## 2022-12-04 LAB — LIPID PANEL
Chol/HDL Ratio: 1.7 ratio (ref 0.0–5.0)
Cholesterol, Total: 165 mg/dL (ref 100–199)
HDL: 98 mg/dL (ref >39)
LDL Chol Calc (NIH): 56 mg/dL (ref 0–99)
Triglycerides: 51 mg/dL (ref 0–149)
VLDL Cholesterol Cal: 11 mg/dL (ref 5–40)

## 2022-12-04 LAB — HIV-1 RNA QUANT-NO REFLEX-BLD: HIV-1 RNA Viral Load: <20 {copies}/mL

## 2022-12-10 LAB — URINE CYTOLOGY ANCILLARY ONLY
Chlamydia: NEGATIVE
Comment: NEGATIVE
Comment: NORMAL
Neisseria Gonorrhea: NEGATIVE

## 2023-04-19 ENCOUNTER — Other Ambulatory Visit (HOSPITAL_COMMUNITY): Payer: Self-pay

## 2023-04-22 ENCOUNTER — Ambulatory Visit: Payer: Medicaid Other

## 2023-04-22 ENCOUNTER — Other Ambulatory Visit: Payer: Self-pay

## 2023-04-27 ENCOUNTER — Telehealth: Payer: Self-pay

## 2023-04-27 NOTE — Telephone Encounter (Signed)
Patient called regarding the [place] program he stated he has applied for medicaid and he does not qualify. He is currently uninsured. Would you like to continue to see him here? at which he will be billed or would you like for him to be seen at [facility]?

## 2023-04-28 NOTE — Telephone Encounter (Signed)
I called the patient I was unable to reach him, his mailbox is full.

## 2023-06-11 ENCOUNTER — Other Ambulatory Visit: Payer: Self-pay | Admitting: Infectious Diseases

## 2023-06-11 DIAGNOSIS — Z21 Asymptomatic human immunodeficiency virus [HIV] infection status: Secondary | ICD-10-CM

## 2023-06-11 NOTE — Telephone Encounter (Signed)
 Patient was last seen 12/02/22 I called the patient to schedule a fu appointment with you, I am unable to reach the patient his mailbox is full.

## 2023-07-13 ENCOUNTER — Other Ambulatory Visit (HOSPITAL_COMMUNITY): Payer: Self-pay

## 2023-07-13 ENCOUNTER — Telehealth: Payer: Self-pay

## 2023-07-13 ENCOUNTER — Other Ambulatory Visit: Payer: Self-pay

## 2023-07-13 DIAGNOSIS — Z21 Asymptomatic human immunodeficiency virus [HIV] infection status: Secondary | ICD-10-CM

## 2023-07-13 MED ORDER — ODEFSEY 200-25-25 MG PO TABS
1.0000 | ORAL_TABLET | Freq: Every day | ORAL | 1 refills | Status: DC
Start: 2023-07-13 — End: 2023-09-24

## 2023-07-13 NOTE — Telephone Encounter (Addendum)
 Patient called stating that he is having trouble getting his medication. Patient has ADAP. Internal Medicine Clinic states that they don't accept ADAP at this moment. Odefsey last filled by Dr.Hatcher for 90 day supply. ADAP will not cover a 90 day supply of medication  Patient stated that he would like to resume care at our office.    Patient scheduled for follow up Verlene Mayer on 08/27/2023.  Refills for [facility] sent to [facility] on [address] until patient follow up appointment.    Aerielle Stoklosa Lesli Albee, CMA

## 2023-07-13 NOTE — Telephone Encounter (Signed)
 Returned patients call regarding assistance with [facility] cost. Informed him our office doesn't do ADAP.   Patient now aware and has made an appt at [facility].   Also suggested he reach out to ADAP regarding a previous bill from our office for labs that he was unaware wouldn't be covered here. Patient appreciated the help.

## 2023-08-11 ENCOUNTER — Other Ambulatory Visit: Payer: Self-pay

## 2023-08-11 DIAGNOSIS — Z113 Encounter for screening for infections with a predominantly sexual mode of transmission: Secondary | ICD-10-CM

## 2023-08-11 DIAGNOSIS — Z79899 Other long term (current) drug therapy: Secondary | ICD-10-CM

## 2023-08-11 DIAGNOSIS — Z21 Asymptomatic human immunodeficiency virus [HIV] infection status: Secondary | ICD-10-CM

## 2023-08-20 ENCOUNTER — Other Ambulatory Visit

## 2023-08-23 ENCOUNTER — Other Ambulatory Visit

## 2023-08-23 NOTE — Addendum Note (Signed)
 Addended by: Arlon Bergamo D on: 08/23/2023 08:31 AM   Modules accepted: Orders

## 2023-08-27 ENCOUNTER — Ambulatory Visit: Admitting: Family

## 2023-08-31 ENCOUNTER — Other Ambulatory Visit

## 2023-09-01 ENCOUNTER — Other Ambulatory Visit

## 2023-09-23 ENCOUNTER — Telehealth: Payer: Self-pay

## 2023-09-23 NOTE — Telephone Encounter (Signed)
 Andrew Farmer requesting a call back regarding his upcoming appt. He stated he Farmer previously and no one returned the call. I called back but VM was full.

## 2023-09-24 ENCOUNTER — Other Ambulatory Visit: Payer: Self-pay

## 2023-09-24 ENCOUNTER — Other Ambulatory Visit

## 2023-09-24 ENCOUNTER — Other Ambulatory Visit: Payer: Self-pay | Admitting: Family

## 2023-09-24 ENCOUNTER — Ambulatory Visit: Payer: Self-pay | Admitting: Family

## 2023-09-24 DIAGNOSIS — Z113 Encounter for screening for infections with a predominantly sexual mode of transmission: Secondary | ICD-10-CM

## 2023-09-24 DIAGNOSIS — Z21 Asymptomatic human immunodeficiency virus [HIV] infection status: Secondary | ICD-10-CM

## 2023-09-24 DIAGNOSIS — Z79899 Other long term (current) drug therapy: Secondary | ICD-10-CM

## 2023-09-24 LAB — CBC WITH DIFFERENTIAL/PLATELET
Absolute Monocytes: 611 {cells}/uL (ref 200–950)
Basophils Absolute: 28 {cells}/uL (ref 0–200)
Eosinophils Relative: 0.6 %
HCT: 45 % (ref 38.5–50.0)
Hemoglobin: 15.4 g/dL (ref 13.2–17.1)
MCHC: 34.2 g/dL (ref 32.0–36.0)
Neutro Abs: 2016 {cells}/uL (ref 1500–7800)
Neutrophils Relative %: 42.9 %
Platelets: 198 10*3/uL (ref 140–400)

## 2023-09-24 NOTE — Addendum Note (Signed)
 Addended by: Arlon Bergamo D on: 09/24/2023 10:11 AM   Modules accepted: Orders

## 2023-09-27 LAB — T-HELPER CELLS (CD4) COUNT (NOT AT ARMC)
Absolute CD4: 894 {cells}/uL (ref 490–1740)
CD4 T Helper %: 40 % (ref 30–61)
Total lymphocyte count: 2213 {cells}/uL (ref 850–3900)

## 2023-09-27 LAB — COMPLETE METABOLIC PANEL WITHOUT GFR
AG Ratio: 1.6 (calc) (ref 1.0–2.5)
ALT: 15 U/L (ref 9–46)
AST: 26 U/L (ref 10–35)
Albumin: 4.3 g/dL (ref 3.6–5.1)
Alkaline phosphatase (APISO): 43 U/L (ref 35–144)
BUN: 17 mg/dL (ref 7–25)
CO2: 22 mmol/L (ref 20–32)
Calcium: 9.2 mg/dL (ref 8.6–10.3)
Chloride: 99 mmol/L (ref 98–110)
Creat: 1.29 mg/dL (ref 0.70–1.30)
Globulin: 2.7 g/dL (ref 1.9–3.7)
Glucose, Bld: 111 mg/dL — ABNORMAL HIGH (ref 65–99)
Potassium: 4.3 mmol/L (ref 3.5–5.3)
Sodium: 130 mmol/L — ABNORMAL LOW (ref 135–146)
Total Bilirubin: 0.3 mg/dL (ref 0.2–1.2)
Total Protein: 7 g/dL (ref 6.1–8.1)

## 2023-09-27 LAB — CBC WITH DIFFERENTIAL/PLATELET
Absolute Lymphocytes: 2016 {cells}/uL (ref 850–3900)
Basophils Relative: 0.6 %
Eosinophils Absolute: 28 {cells}/uL (ref 15–500)
MCH: 33.4 pg — ABNORMAL HIGH (ref 27.0–33.0)
MCV: 97.6 fL (ref 80.0–100.0)
MPV: 9.3 fL (ref 7.5–12.5)
Monocytes Relative: 13 %
RBC: 4.61 10*6/uL (ref 4.20–5.80)
RDW: 11.1 % (ref 11.0–15.0)
Total Lymphocyte: 42.9 %
WBC: 4.7 10*3/uL (ref 3.8–10.8)

## 2023-09-27 LAB — HIV-1 RNA QUANT-NO REFLEX-BLD
HIV 1 RNA Quant: <20 {copies}/mL — AB
HIV-1 RNA Quant, Log: <1.3 {Log_copies}/mL — AB

## 2023-09-27 LAB — RPR: RPR Ser Ql: NONREACTIVE

## 2023-09-28 ENCOUNTER — Ambulatory Visit: Payer: Self-pay | Admitting: Family

## 2023-10-22 ENCOUNTER — Encounter: Payer: Self-pay | Admitting: Family

## 2023-10-22 ENCOUNTER — Ambulatory Visit (INDEPENDENT_AMBULATORY_CARE_PROVIDER_SITE_OTHER): Payer: Self-pay | Admitting: Family

## 2023-10-22 ENCOUNTER — Other Ambulatory Visit: Payer: Self-pay

## 2023-10-22 VITALS — BP 114/74 | HR 83 | Temp 98.1°F | Ht 68.0 in | Wt 138.0 lb

## 2023-10-22 DIAGNOSIS — Z720 Tobacco use: Secondary | ICD-10-CM

## 2023-10-22 DIAGNOSIS — Z0000 Encounter for general adult medical examination without abnormal findings: Secondary | ICD-10-CM

## 2023-10-22 DIAGNOSIS — Z21 Asymptomatic human immunodeficiency virus [HIV] infection status: Secondary | ICD-10-CM

## 2023-10-22 DIAGNOSIS — F17210 Nicotine dependence, cigarettes, uncomplicated: Secondary | ICD-10-CM

## 2023-10-22 DIAGNOSIS — B20 Human immunodeficiency virus [HIV] disease: Secondary | ICD-10-CM

## 2023-10-22 MED ORDER — ODEFSEY 200-25-25 MG PO TABS: 1.0000 | ORAL_TABLET | Freq: Every day | ORAL | 11 refills | Status: AC

## 2023-10-22 NOTE — Assessment & Plan Note (Signed)
 Andrew Farmer continues to smoke tobacco daily. Counseled on the dangers of tobacco not ready to quit at this time.  Reviewed strategies to maximize success, including removing cigarettes and smoking materials from environment, stress management, substitution of other forms of reinforcement, support of family/friends, and written materials.

## 2023-10-22 NOTE — Assessment & Plan Note (Signed)
 Mr. Allende continues to have well-controlled virus with good adherence and tolerance to Odefsey .  Reviewed lab work and discussed plan of care and U equals U.  Covered by ADAP and will need to renew financial assistance prior to the end of the year.  Social determinants of health reviewed with no interventions indicated.  Continue current dose of Odefsey .  Plan for follow-up in 9 months or sooner if needed with lab work 1 to 2 weeks prior to appointment.

## 2023-10-22 NOTE — Progress Notes (Signed)
 Brief Narrative   Patient ID: Andrew Farmer, male    DOB: 09/13/1965, 58 y.o.   MRN: [id number]  Andrew Farmer is a 57 y/o caucasian male with HIV disease diagnosed in February 2012. Risk factor for HIV is MSM. Initial CD4 count of 250 and vrial load of 43,000. Genotype without any significant drug resistance patterns. No history of opportunistic infection. [id number] negative. Previous ART regimen of Atripla, Complara, and now Odefsey .   Subjective:   Chief Complaint  Patient presents with   Follow-up    B20    HPI:  Andrew Farmer is a 58 y.o. male with HIV disease last seen by Dr. Eben on 11/12/2022 with well-controlled virus and good adherence and tolerance to Odefsey .  Viral load was undetectable with CD4 count 1007.  Kidney function within normal ranges with elevated AST of 63 and ALT of 54.  Most recent lab work completed on 09/24/2023 with viral load that remains undetectable and CD4 count 894.  Kidney function, liver function, electrolytes within normal ranges.  RPR nonreactive for syphilis.  10-year ASCVD risk score of 4.9%.  Here today for routine follow-up.  Andrew Farmer has been doing well since his last office visit and continues to take Odefsey  as prescribed with no adverse side effects or problems obtaining medication from pharmacy.  Covered by [employer] and will need to renew financial assistance at the end of the year.  No new concerns/complaints.  Housing, transportation, and access to food are stable.  Continues to work on the farm.  Healthcare maintenance reviewed.  Has dentures with no regular routine dental care.  Not currently sexually active.  Denies fevers, chills, night sweats, headaches, changes in vision, neck pain/stiffness, nausea, diarrhea, vomiting, lesions or rashes.  Lab Results  Component Value Date   CD4TCELL 40 09/24/2023   CD4TABS 1,007 12/02/2022   Lab Results  Component Value Date   HIV1RNAQUANT <20 DETECTED (A) 09/24/2023     Allergies  Allergen Reactions    Cat Dander       Outpatient Medications Prior to Visit  Medication Sig Dispense Refill   olmesartan  (BENICAR ) 40 MG tablet TAKE 1 TABLET(40 MG) BY MOUTH DAILY 90 tablet 3   ODEFSEY  200-25-25 MG TABS tablet TAKE 1 TABLET BY MOUTH DAILY 30 tablet 0   No facility-administered medications prior to visit.     Past Medical History:  Diagnosis Date   Viral gastroenteritis 08/17/2018     History reviewed. No pertinent surgical history.   Review of Systems  Constitutional:  Negative for appetite change, chills, fatigue, fever and unexpected weight change.  Eyes:  Negative for visual disturbance.  Respiratory:  Negative for cough, chest tightness, shortness of breath and wheezing.   Cardiovascular:  Negative for chest pain and leg swelling.  Gastrointestinal:  Negative for abdominal pain, constipation, diarrhea, nausea and vomiting.  Genitourinary:  Negative for dysuria, flank pain, frequency, genital sores, hematuria and urgency.  Skin:  Negative for rash.  Allergic/Immunologic: Negative for immunocompromised state.  Neurological:  Negative for dizziness and headaches.     Objective:   BP 114/74   Pulse 83   Temp 98.1 F (36.7 C) (Temporal)   Ht 5' 8 (1.727 m)   Wt 138 lb (62.6 kg)   SpO2 95%   BMI 20.98 kg/m  Nursing note and vital signs reviewed.  Physical Exam Constitutional:      General: He is not in acute distress.    Appearance: He is well-developed.  Eyes:  Conjunctiva/sclera: Conjunctivae normal.  Cardiovascular:     Rate and Rhythm: Normal rate and regular rhythm.     Heart sounds: Normal heart sounds. No murmur heard.    No friction rub. No gallop.  Pulmonary:     Effort: Pulmonary effort is normal. No respiratory distress.     Breath sounds: Normal breath sounds. No wheezing or rales.  Chest:     Chest wall: No tenderness.  Abdominal:     General: Bowel sounds are normal.     Palpations: Abdomen is soft.     Tenderness: There is no abdominal  tenderness.  Musculoskeletal:     Cervical back: Neck supple.  Lymphadenopathy:     Cervical: No cervical adenopathy.  Skin:    General: Skin is warm and dry.     Findings: No rash.  Neurological:     Mental Status: He is alert and oriented to person, place, and time.  Psychiatric:        Behavior: Behavior normal.        Thought Content: Thought content normal.        Judgment: Judgment normal.          10/22/2023    9:52 AM 12/02/2022    4:58 PM 12/11/2021    3:26 PM 09/20/2020   10:07 AM 08/17/2018    2:31 PM  Depression screen PHQ 2/9  Decreased Interest 0 0 0 0 0  Down, Depressed, Hopeless 0 0 0 0 0  PHQ - 2 Score 0 0 0 0 0  Altered sleeping 0 0     Tired, decreased energy 0 0     Change in appetite 0 1     Feeling bad or failure about yourself  0 0     Trouble concentrating 0 0     Moving slowly or fidgety/restless 0 0     Suicidal thoughts 0 0     PHQ-9 Score 0 1           10/22/2023    9:53 AM 12/02/2022    4:58 PM  GAD 7 : Generalized Anxiety Score  Nervous, Anxious, on Edge 0 0  Control/stop worrying 0 1  Worry too much - different things 0 1  Trouble relaxing 0 0  Restless 0 1  Easily annoyed or irritable 0 0  Afraid - awful might happen 0 0  Total GAD 7 Score 0 3     The 10-year ASCVD risk score (Arnett DK, et al., 2019) is: 4.9%   Values used to calculate the score:     Age: 59 years     Clincally relevant sex: Male     Is Non-Hispanic African American: No     Diabetic: No     Tobacco smoker: Yes     Systolic Blood Pressure: 114 mmHg     Is BP treated: Yes     HDL Cholesterol: 98 mg/dL     Total Cholesterol: 165 mg/dL      Assessment & Plan:    Patient Active Problem List   Diagnosis Date Noted   Elevated LFTs 12/12/2021   Alcohol consumption heavy 03/04/2020   Healthcare maintenance 08/17/2018   Elevated liver enzymes 02/10/2018   Hepatitis B immune 05/06/2016   Tobacco abuse 06/10/2011   Essential hypertension 10/27/2010    Depression with anxiety 10/27/2010   HIV (human immunodeficiency virus infection) (HCC) 10/27/2010     Problem List Items Addressed This Visit       Other  Tobacco abuse (Chronic)   Andrew Farmer continues to smoke tobacco daily. Counseled on the dangers of tobacco not ready to quit at this time.  Reviewed strategies to maximize success, including removing cigarettes and smoking materials from environment, stress management, substitution of other forms of reinforcement, support of family/friends, and written materials.        HIV (human immunodeficiency virus infection) (HCC) - Primary   Andrew Farmer continues to have well-controlled virus with good adherence and tolerance to Odefsey .  Reviewed lab work and discussed plan of care and U equals U.  Covered by [employer] and will need to renew financial assistance prior to the end of the year.  Social determinants of health reviewed with no interventions indicated.  Continue current dose of Odefsey .  Plan for follow-up in 9 months or sooner if needed with lab work 1 to 2 weeks prior to appointment.      Relevant Medications   emtricitabine-rilpivir-tenofovir  AF (ODEFSEY ) 200-25-25 MG TABS tablet   Healthcare maintenance   Not currently sexually active. Vaccinations were reviewed and following counseling declined today.  Due for tetanus and will be due for Prevnar 20. Colon cancer screening up-to-date Anal cancer screening up-to-date.        I have changed Andrew Farmer's Odefsey . I am also having him maintain his olmesartan .   Meds ordered this encounter  Medications   emtricitabine-rilpivir-tenofovir  AF (ODEFSEY ) 200-25-25 MG TABS tablet    Sig: Take 1 tablet by mouth daily.    Dispense:  30 tablet    Refill:  11    Supervising Provider:   LUIZ CHANNEL [phone]    Prescription Type::   Renewal     Follow-up: Return in about 9 months (around 07/22/2024). or sooner if needed.    Cathlyn July, MSN, FNP-C Nurse Practitioner [facility] Main number: [phone]

## 2023-10-22 NOTE — Patient Instructions (Addendum)
 Nice to see you.  Continue to take your medication daily as prescribed.  Refills have been sent to the pharmacy.  Plan for follow up in 9 months or sooner if needed with lab work on the same day.  Have a great day and stay safe!    Smoking Cessation: QuitlineNC 1-800-QUIT-NOW (343) 746-7483); Espaol: 1-855-Djelo-Ya (1-(786) 332-2854) http://carroll-castaneda.info/

## 2023-10-22 NOTE — Assessment & Plan Note (Signed)
 Not currently sexually active. Vaccinations were reviewed and following counseling declined today.  Due for tetanus and will be due for Prevnar 20. Colon cancer screening up-to-date Anal cancer screening up-to-date.

## 2024-04-26 ENCOUNTER — Other Ambulatory Visit (HOSPITAL_COMMUNITY): Payer: Self-pay

## 2024-05-01 ENCOUNTER — Other Ambulatory Visit (HOSPITAL_COMMUNITY): Payer: Self-pay

## 2024-05-05 ENCOUNTER — Other Ambulatory Visit: Payer: Self-pay

## 2024-05-09 ENCOUNTER — Telehealth: Payer: Self-pay

## 2024-05-12 ENCOUNTER — Other Ambulatory Visit: Payer: Self-pay

## 2024-05-12 DIAGNOSIS — Z21 Asymptomatic human immunodeficiency virus [HIV] infection status: Secondary | ICD-10-CM

## 2024-05-12 DIAGNOSIS — Z79899 Other long term (current) drug therapy: Secondary | ICD-10-CM

## 2024-05-12 DIAGNOSIS — Z113 Encounter for screening for infections with a predominantly sexual mode of transmission: Secondary | ICD-10-CM
